# Patient Record
Sex: Male | Born: 1935
Health system: Southern US, Community
[De-identification: ages and names within clinical notes are randomized; demographics above are authoritative.]

## PROBLEM LIST (undated history)

## (undated) DIAGNOSIS — I5042 Chronic combined systolic (congestive) and diastolic (congestive) heart failure: Secondary | ICD-10-CM

## (undated) DIAGNOSIS — Z7901 Long term (current) use of anticoagulants: Secondary | ICD-10-CM

## (undated) DIAGNOSIS — I482 Chronic atrial fibrillation, unspecified: Secondary | ICD-10-CM

## (undated) DIAGNOSIS — I10 Essential (primary) hypertension: Secondary | ICD-10-CM

## (undated) DIAGNOSIS — H409 Unspecified glaucoma: Secondary | ICD-10-CM

## (undated) DIAGNOSIS — E871 Hypo-osmolality and hyponatremia: Secondary | ICD-10-CM

## (undated) DIAGNOSIS — T7840XA Allergy, unspecified, initial encounter: Secondary | ICD-10-CM

## (undated) DIAGNOSIS — G629 Polyneuropathy, unspecified: Secondary | ICD-10-CM

## (undated) DIAGNOSIS — K635 Polyp of colon: Secondary | ICD-10-CM

## (undated) DIAGNOSIS — K5792 Diverticulitis of intestine, part unspecified, without perforation or abscess without bleeding: Secondary | ICD-10-CM

## (undated) DIAGNOSIS — M1990 Unspecified osteoarthritis, unspecified site: Secondary | ICD-10-CM

## (undated) DIAGNOSIS — B019 Varicella without complication: Secondary | ICD-10-CM

## (undated) DIAGNOSIS — R011 Cardiac murmur, unspecified: Secondary | ICD-10-CM

## (undated) HISTORY — DX: Diverticulitis of intestine, part unspecified, without perforation or abscess without bleeding: K57.92

## (undated) HISTORY — DX: Essential (primary) hypertension: I10

## (undated) HISTORY — PX: APPENDECTOMY: SHX54

## (undated) HISTORY — DX: Varicella without complication: B01.9

## (undated) HISTORY — DX: Long term (current) use of anticoagulants: Z79.01

## (undated) HISTORY — DX: Unspecified osteoarthritis, unspecified site: M19.90

## (undated) HISTORY — DX: Chronic combined systolic (congestive) and diastolic (congestive) heart failure: I50.42

## (undated) HISTORY — DX: Hypo-osmolality and hyponatremia: E87.1

## (undated) HISTORY — DX: Chronic atrial fibrillation, unspecified: I48.20

## (undated) HISTORY — DX: Polyp of colon: K63.5

## (undated) HISTORY — PX: OTHER SURGICAL HISTORY: SHX169

## (undated) HISTORY — DX: Cardiac murmur, unspecified: R01.1

## (undated) HISTORY — DX: Unspecified glaucoma: H40.9

## (undated) HISTORY — DX: Allergy, unspecified, initial encounter: T78.40XA

---

## 2011-01-12 LAB — HM COLONOSCOPY

## 2011-05-09 DIAGNOSIS — I4891 Unspecified atrial fibrillation: Secondary | ICD-10-CM | POA: Diagnosis not present

## 2011-05-09 DIAGNOSIS — E669 Obesity, unspecified: Secondary | ICD-10-CM | POA: Diagnosis not present

## 2011-05-09 DIAGNOSIS — J029 Acute pharyngitis, unspecified: Secondary | ICD-10-CM | POA: Diagnosis not present

## 2011-05-09 DIAGNOSIS — I10 Essential (primary) hypertension: Secondary | ICD-10-CM | POA: Diagnosis not present

## 2011-05-11 DIAGNOSIS — I4891 Unspecified atrial fibrillation: Secondary | ICD-10-CM | POA: Diagnosis not present

## 2011-06-08 DIAGNOSIS — I4891 Unspecified atrial fibrillation: Secondary | ICD-10-CM | POA: Diagnosis not present

## 2011-06-30 DIAGNOSIS — Z86010 Personal history of colonic polyps: Secondary | ICD-10-CM | POA: Diagnosis not present

## 2011-06-30 DIAGNOSIS — K5730 Diverticulosis of large intestine without perforation or abscess without bleeding: Secondary | ICD-10-CM | POA: Diagnosis not present

## 2011-07-08 DIAGNOSIS — I10 Essential (primary) hypertension: Secondary | ICD-10-CM | POA: Diagnosis not present

## 2011-07-08 DIAGNOSIS — E162 Hypoglycemia, unspecified: Secondary | ICD-10-CM | POA: Diagnosis not present

## 2011-07-15 DIAGNOSIS — I4891 Unspecified atrial fibrillation: Secondary | ICD-10-CM | POA: Diagnosis not present

## 2011-08-07 DIAGNOSIS — Z7901 Long term (current) use of anticoagulants: Secondary | ICD-10-CM | POA: Diagnosis not present

## 2011-08-07 DIAGNOSIS — I4891 Unspecified atrial fibrillation: Secondary | ICD-10-CM | POA: Diagnosis not present

## 2011-09-04 DIAGNOSIS — I4891 Unspecified atrial fibrillation: Secondary | ICD-10-CM | POA: Diagnosis not present

## 2011-09-04 DIAGNOSIS — Z7901 Long term (current) use of anticoagulants: Secondary | ICD-10-CM | POA: Diagnosis not present

## 2011-09-24 DIAGNOSIS — H524 Presbyopia: Secondary | ICD-10-CM | POA: Diagnosis not present

## 2011-09-24 DIAGNOSIS — Z961 Presence of intraocular lens: Secondary | ICD-10-CM | POA: Diagnosis not present

## 2011-09-24 DIAGNOSIS — H40019 Open angle with borderline findings, low risk, unspecified eye: Secondary | ICD-10-CM | POA: Diagnosis not present

## 2011-10-04 DIAGNOSIS — J209 Acute bronchitis, unspecified: Secondary | ICD-10-CM | POA: Diagnosis not present

## 2011-10-06 DIAGNOSIS — I4891 Unspecified atrial fibrillation: Secondary | ICD-10-CM | POA: Diagnosis not present

## 2011-10-07 DIAGNOSIS — J209 Acute bronchitis, unspecified: Secondary | ICD-10-CM | POA: Diagnosis not present

## 2011-11-03 DIAGNOSIS — I4891 Unspecified atrial fibrillation: Secondary | ICD-10-CM | POA: Diagnosis not present

## 2011-11-03 DIAGNOSIS — Z7901 Long term (current) use of anticoagulants: Secondary | ICD-10-CM | POA: Diagnosis not present

## 2011-11-16 DIAGNOSIS — I4891 Unspecified atrial fibrillation: Secondary | ICD-10-CM | POA: Diagnosis not present

## 2011-11-30 DIAGNOSIS — Z7901 Long term (current) use of anticoagulants: Secondary | ICD-10-CM | POA: Diagnosis not present

## 2011-11-30 DIAGNOSIS — I4891 Unspecified atrial fibrillation: Secondary | ICD-10-CM | POA: Diagnosis not present

## 2011-12-25 DIAGNOSIS — H40019 Open angle with borderline findings, low risk, unspecified eye: Secondary | ICD-10-CM | POA: Diagnosis not present

## 2011-12-28 DIAGNOSIS — I4891 Unspecified atrial fibrillation: Secondary | ICD-10-CM | POA: Diagnosis not present

## 2011-12-28 DIAGNOSIS — Z7901 Long term (current) use of anticoagulants: Secondary | ICD-10-CM | POA: Diagnosis not present

## 2011-12-30 DIAGNOSIS — H348392 Tributary (branch) retinal vein occlusion, unspecified eye, stable: Secondary | ICD-10-CM | POA: Diagnosis not present

## 2011-12-30 DIAGNOSIS — Z961 Presence of intraocular lens: Secondary | ICD-10-CM | POA: Diagnosis not present

## 2011-12-30 DIAGNOSIS — H01009 Unspecified blepharitis unspecified eye, unspecified eyelid: Secondary | ICD-10-CM | POA: Diagnosis not present

## 2011-12-30 DIAGNOSIS — H5315 Visual distortions of shape and size: Secondary | ICD-10-CM | POA: Diagnosis not present

## 2011-12-30 DIAGNOSIS — H35349 Macular cyst, hole, or pseudohole, unspecified eye: Secondary | ICD-10-CM | POA: Diagnosis not present

## 2011-12-30 DIAGNOSIS — H35359 Cystoid macular degeneration, unspecified eye: Secondary | ICD-10-CM | POA: Diagnosis not present

## 2012-01-06 DIAGNOSIS — H35359 Cystoid macular degeneration, unspecified eye: Secondary | ICD-10-CM | POA: Diagnosis not present

## 2012-01-06 DIAGNOSIS — H01009 Unspecified blepharitis unspecified eye, unspecified eyelid: Secondary | ICD-10-CM | POA: Diagnosis not present

## 2012-01-06 DIAGNOSIS — H5315 Visual distortions of shape and size: Secondary | ICD-10-CM | POA: Diagnosis not present

## 2012-01-06 DIAGNOSIS — H35349 Macular cyst, hole, or pseudohole, unspecified eye: Secondary | ICD-10-CM | POA: Diagnosis not present

## 2012-01-06 DIAGNOSIS — H348392 Tributary (branch) retinal vein occlusion, unspecified eye, stable: Secondary | ICD-10-CM | POA: Diagnosis not present

## 2012-01-06 DIAGNOSIS — Z961 Presence of intraocular lens: Secondary | ICD-10-CM | POA: Diagnosis not present

## 2012-01-11 DIAGNOSIS — I4891 Unspecified atrial fibrillation: Secondary | ICD-10-CM | POA: Diagnosis not present

## 2012-01-11 DIAGNOSIS — Z7901 Long term (current) use of anticoagulants: Secondary | ICD-10-CM | POA: Diagnosis not present

## 2012-01-12 DIAGNOSIS — H348392 Tributary (branch) retinal vein occlusion, unspecified eye, stable: Secondary | ICD-10-CM | POA: Diagnosis not present

## 2012-01-12 DIAGNOSIS — H35349 Macular cyst, hole, or pseudohole, unspecified eye: Secondary | ICD-10-CM | POA: Diagnosis not present

## 2012-01-12 DIAGNOSIS — H35359 Cystoid macular degeneration, unspecified eye: Secondary | ICD-10-CM | POA: Diagnosis not present

## 2012-01-12 DIAGNOSIS — H01009 Unspecified blepharitis unspecified eye, unspecified eyelid: Secondary | ICD-10-CM | POA: Diagnosis not present

## 2012-01-12 DIAGNOSIS — H5315 Visual distortions of shape and size: Secondary | ICD-10-CM | POA: Diagnosis not present

## 2012-01-12 DIAGNOSIS — Z961 Presence of intraocular lens: Secondary | ICD-10-CM | POA: Diagnosis not present

## 2012-01-25 DIAGNOSIS — I4891 Unspecified atrial fibrillation: Secondary | ICD-10-CM | POA: Diagnosis not present

## 2012-01-25 DIAGNOSIS — Z7901 Long term (current) use of anticoagulants: Secondary | ICD-10-CM | POA: Diagnosis not present

## 2012-02-05 DIAGNOSIS — Z23 Encounter for immunization: Secondary | ICD-10-CM | POA: Diagnosis not present

## 2012-02-17 DIAGNOSIS — H348392 Tributary (branch) retinal vein occlusion, unspecified eye, stable: Secondary | ICD-10-CM | POA: Diagnosis not present

## 2012-02-17 DIAGNOSIS — H35349 Macular cyst, hole, or pseudohole, unspecified eye: Secondary | ICD-10-CM | POA: Diagnosis not present

## 2012-02-17 DIAGNOSIS — H35359 Cystoid macular degeneration, unspecified eye: Secondary | ICD-10-CM | POA: Diagnosis not present

## 2012-02-17 DIAGNOSIS — Z961 Presence of intraocular lens: Secondary | ICD-10-CM | POA: Diagnosis not present

## 2012-02-17 DIAGNOSIS — H5315 Visual distortions of shape and size: Secondary | ICD-10-CM | POA: Diagnosis not present

## 2012-02-17 DIAGNOSIS — H01009 Unspecified blepharitis unspecified eye, unspecified eyelid: Secondary | ICD-10-CM | POA: Diagnosis not present

## 2012-02-22 DIAGNOSIS — E785 Hyperlipidemia, unspecified: Secondary | ICD-10-CM | POA: Diagnosis not present

## 2012-03-16 DIAGNOSIS — Z7901 Long term (current) use of anticoagulants: Secondary | ICD-10-CM | POA: Diagnosis not present

## 2012-03-16 DIAGNOSIS — I4891 Unspecified atrial fibrillation: Secondary | ICD-10-CM | POA: Diagnosis not present

## 2012-03-18 DIAGNOSIS — H348392 Tributary (branch) retinal vein occlusion, unspecified eye, stable: Secondary | ICD-10-CM | POA: Diagnosis not present

## 2012-03-18 DIAGNOSIS — H01009 Unspecified blepharitis unspecified eye, unspecified eyelid: Secondary | ICD-10-CM | POA: Diagnosis not present

## 2012-03-18 DIAGNOSIS — H43819 Vitreous degeneration, unspecified eye: Secondary | ICD-10-CM | POA: Diagnosis not present

## 2012-03-18 DIAGNOSIS — Z961 Presence of intraocular lens: Secondary | ICD-10-CM | POA: Diagnosis not present

## 2012-03-18 DIAGNOSIS — H5315 Visual distortions of shape and size: Secondary | ICD-10-CM | POA: Diagnosis not present

## 2012-03-18 DIAGNOSIS — H02839 Dermatochalasis of unspecified eye, unspecified eyelid: Secondary | ICD-10-CM | POA: Diagnosis not present

## 2012-03-18 DIAGNOSIS — H35349 Macular cyst, hole, or pseudohole, unspecified eye: Secondary | ICD-10-CM | POA: Diagnosis not present

## 2012-03-18 DIAGNOSIS — H35359 Cystoid macular degeneration, unspecified eye: Secondary | ICD-10-CM | POA: Diagnosis not present

## 2012-03-21 DIAGNOSIS — M1990 Unspecified osteoarthritis, unspecified site: Secondary | ICD-10-CM | POA: Diagnosis not present

## 2012-03-21 DIAGNOSIS — I4891 Unspecified atrial fibrillation: Secondary | ICD-10-CM | POA: Diagnosis not present

## 2012-03-21 DIAGNOSIS — M25529 Pain in unspecified elbow: Secondary | ICD-10-CM | POA: Diagnosis not present

## 2012-03-23 DIAGNOSIS — H43819 Vitreous degeneration, unspecified eye: Secondary | ICD-10-CM | POA: Diagnosis not present

## 2012-03-23 DIAGNOSIS — H02839 Dermatochalasis of unspecified eye, unspecified eyelid: Secondary | ICD-10-CM | POA: Diagnosis not present

## 2012-03-23 DIAGNOSIS — H35349 Macular cyst, hole, or pseudohole, unspecified eye: Secondary | ICD-10-CM | POA: Diagnosis not present

## 2012-03-23 DIAGNOSIS — H348392 Tributary (branch) retinal vein occlusion, unspecified eye, stable: Secondary | ICD-10-CM | POA: Diagnosis not present

## 2012-03-23 DIAGNOSIS — Z961 Presence of intraocular lens: Secondary | ICD-10-CM | POA: Diagnosis not present

## 2012-03-23 DIAGNOSIS — H01009 Unspecified blepharitis unspecified eye, unspecified eyelid: Secondary | ICD-10-CM | POA: Diagnosis not present

## 2012-03-23 DIAGNOSIS — H35359 Cystoid macular degeneration, unspecified eye: Secondary | ICD-10-CM | POA: Diagnosis not present

## 2012-03-23 DIAGNOSIS — H5315 Visual distortions of shape and size: Secondary | ICD-10-CM | POA: Diagnosis not present

## 2012-03-29 DIAGNOSIS — H5315 Visual distortions of shape and size: Secondary | ICD-10-CM | POA: Diagnosis not present

## 2012-03-29 DIAGNOSIS — H348392 Tributary (branch) retinal vein occlusion, unspecified eye, stable: Secondary | ICD-10-CM | POA: Diagnosis not present

## 2012-03-29 DIAGNOSIS — H01009 Unspecified blepharitis unspecified eye, unspecified eyelid: Secondary | ICD-10-CM | POA: Diagnosis not present

## 2012-03-29 DIAGNOSIS — H02839 Dermatochalasis of unspecified eye, unspecified eyelid: Secondary | ICD-10-CM | POA: Diagnosis not present

## 2012-03-29 DIAGNOSIS — H43819 Vitreous degeneration, unspecified eye: Secondary | ICD-10-CM | POA: Diagnosis not present

## 2012-03-29 DIAGNOSIS — Z961 Presence of intraocular lens: Secondary | ICD-10-CM | POA: Diagnosis not present

## 2012-03-29 DIAGNOSIS — H35359 Cystoid macular degeneration, unspecified eye: Secondary | ICD-10-CM | POA: Diagnosis not present

## 2012-03-29 DIAGNOSIS — H35349 Macular cyst, hole, or pseudohole, unspecified eye: Secondary | ICD-10-CM | POA: Diagnosis not present

## 2012-04-18 DIAGNOSIS — Z7901 Long term (current) use of anticoagulants: Secondary | ICD-10-CM | POA: Diagnosis not present

## 2012-04-18 DIAGNOSIS — I4891 Unspecified atrial fibrillation: Secondary | ICD-10-CM | POA: Diagnosis not present

## 2012-04-27 DIAGNOSIS — H348392 Tributary (branch) retinal vein occlusion, unspecified eye, stable: Secondary | ICD-10-CM | POA: Diagnosis not present

## 2012-04-27 DIAGNOSIS — H35359 Cystoid macular degeneration, unspecified eye: Secondary | ICD-10-CM | POA: Diagnosis not present

## 2012-04-27 DIAGNOSIS — H01009 Unspecified blepharitis unspecified eye, unspecified eyelid: Secondary | ICD-10-CM | POA: Diagnosis not present

## 2012-04-27 DIAGNOSIS — H5315 Visual distortions of shape and size: Secondary | ICD-10-CM | POA: Diagnosis not present

## 2012-04-27 DIAGNOSIS — Z961 Presence of intraocular lens: Secondary | ICD-10-CM | POA: Diagnosis not present

## 2012-04-27 DIAGNOSIS — H43819 Vitreous degeneration, unspecified eye: Secondary | ICD-10-CM | POA: Diagnosis not present

## 2012-04-27 DIAGNOSIS — H35349 Macular cyst, hole, or pseudohole, unspecified eye: Secondary | ICD-10-CM | POA: Diagnosis not present

## 2012-04-27 DIAGNOSIS — H02839 Dermatochalasis of unspecified eye, unspecified eyelid: Secondary | ICD-10-CM | POA: Diagnosis not present

## 2012-05-06 DIAGNOSIS — H01009 Unspecified blepharitis unspecified eye, unspecified eyelid: Secondary | ICD-10-CM | POA: Diagnosis not present

## 2012-05-06 DIAGNOSIS — Z961 Presence of intraocular lens: Secondary | ICD-10-CM | POA: Diagnosis not present

## 2012-05-06 DIAGNOSIS — H43819 Vitreous degeneration, unspecified eye: Secondary | ICD-10-CM | POA: Diagnosis not present

## 2012-05-06 DIAGNOSIS — H35359 Cystoid macular degeneration, unspecified eye: Secondary | ICD-10-CM | POA: Diagnosis not present

## 2012-05-06 DIAGNOSIS — H5315 Visual distortions of shape and size: Secondary | ICD-10-CM | POA: Diagnosis not present

## 2012-05-06 DIAGNOSIS — H348392 Tributary (branch) retinal vein occlusion, unspecified eye, stable: Secondary | ICD-10-CM | POA: Diagnosis not present

## 2012-05-06 DIAGNOSIS — H35349 Macular cyst, hole, or pseudohole, unspecified eye: Secondary | ICD-10-CM | POA: Diagnosis not present

## 2012-05-06 DIAGNOSIS — H02839 Dermatochalasis of unspecified eye, unspecified eyelid: Secondary | ICD-10-CM | POA: Diagnosis not present

## 2012-05-17 DIAGNOSIS — I4891 Unspecified atrial fibrillation: Secondary | ICD-10-CM | POA: Diagnosis not present

## 2012-05-20 DIAGNOSIS — H43819 Vitreous degeneration, unspecified eye: Secondary | ICD-10-CM | POA: Diagnosis not present

## 2012-05-20 DIAGNOSIS — H02839 Dermatochalasis of unspecified eye, unspecified eyelid: Secondary | ICD-10-CM | POA: Diagnosis not present

## 2012-05-20 DIAGNOSIS — H348392 Tributary (branch) retinal vein occlusion, unspecified eye, stable: Secondary | ICD-10-CM | POA: Diagnosis not present

## 2012-05-20 DIAGNOSIS — H35349 Macular cyst, hole, or pseudohole, unspecified eye: Secondary | ICD-10-CM | POA: Diagnosis not present

## 2012-05-20 DIAGNOSIS — H35359 Cystoid macular degeneration, unspecified eye: Secondary | ICD-10-CM | POA: Diagnosis not present

## 2012-05-20 DIAGNOSIS — H5315 Visual distortions of shape and size: Secondary | ICD-10-CM | POA: Diagnosis not present

## 2012-05-20 DIAGNOSIS — Z961 Presence of intraocular lens: Secondary | ICD-10-CM | POA: Diagnosis not present

## 2012-05-20 DIAGNOSIS — H01009 Unspecified blepharitis unspecified eye, unspecified eyelid: Secondary | ICD-10-CM | POA: Diagnosis not present

## 2012-05-23 DIAGNOSIS — I4891 Unspecified atrial fibrillation: Secondary | ICD-10-CM | POA: Diagnosis not present

## 2012-06-03 DIAGNOSIS — I4891 Unspecified atrial fibrillation: Secondary | ICD-10-CM | POA: Diagnosis not present

## 2012-06-03 DIAGNOSIS — R609 Edema, unspecified: Secondary | ICD-10-CM | POA: Diagnosis not present

## 2012-06-08 DIAGNOSIS — M79609 Pain in unspecified limb: Secondary | ICD-10-CM | POA: Diagnosis not present

## 2012-06-08 DIAGNOSIS — I4891 Unspecified atrial fibrillation: Secondary | ICD-10-CM | POA: Diagnosis not present

## 2012-06-08 DIAGNOSIS — M25569 Pain in unspecified knee: Secondary | ICD-10-CM | POA: Diagnosis not present

## 2012-06-08 DIAGNOSIS — M7989 Other specified soft tissue disorders: Secondary | ICD-10-CM | POA: Diagnosis not present

## 2012-06-16 DIAGNOSIS — Z961 Presence of intraocular lens: Secondary | ICD-10-CM | POA: Diagnosis not present

## 2012-06-16 DIAGNOSIS — H43819 Vitreous degeneration, unspecified eye: Secondary | ICD-10-CM | POA: Diagnosis not present

## 2012-06-16 DIAGNOSIS — H5315 Visual distortions of shape and size: Secondary | ICD-10-CM | POA: Diagnosis not present

## 2012-06-16 DIAGNOSIS — H35349 Macular cyst, hole, or pseudohole, unspecified eye: Secondary | ICD-10-CM | POA: Diagnosis not present

## 2012-06-16 DIAGNOSIS — H01009 Unspecified blepharitis unspecified eye, unspecified eyelid: Secondary | ICD-10-CM | POA: Diagnosis not present

## 2012-06-16 DIAGNOSIS — H35359 Cystoid macular degeneration, unspecified eye: Secondary | ICD-10-CM | POA: Diagnosis not present

## 2012-06-16 DIAGNOSIS — H348392 Tributary (branch) retinal vein occlusion, unspecified eye, stable: Secondary | ICD-10-CM | POA: Diagnosis not present

## 2012-06-16 DIAGNOSIS — H02839 Dermatochalasis of unspecified eye, unspecified eyelid: Secondary | ICD-10-CM | POA: Diagnosis not present

## 2012-06-22 DIAGNOSIS — H40019 Open angle with borderline findings, low risk, unspecified eye: Secondary | ICD-10-CM | POA: Diagnosis not present

## 2012-07-04 DIAGNOSIS — I4891 Unspecified atrial fibrillation: Secondary | ICD-10-CM | POA: Diagnosis not present

## 2012-08-01 DIAGNOSIS — I4891 Unspecified atrial fibrillation: Secondary | ICD-10-CM | POA: Diagnosis not present

## 2012-08-01 DIAGNOSIS — Z7901 Long term (current) use of anticoagulants: Secondary | ICD-10-CM | POA: Diagnosis not present

## 2012-08-29 DIAGNOSIS — Z7901 Long term (current) use of anticoagulants: Secondary | ICD-10-CM | POA: Diagnosis not present

## 2012-08-29 DIAGNOSIS — I4891 Unspecified atrial fibrillation: Secondary | ICD-10-CM | POA: Diagnosis not present

## 2012-08-29 DIAGNOSIS — Z79899 Other long term (current) drug therapy: Secondary | ICD-10-CM | POA: Diagnosis not present

## 2012-08-29 DIAGNOSIS — I10 Essential (primary) hypertension: Secondary | ICD-10-CM | POA: Diagnosis not present

## 2012-08-29 LAB — POCT INR: INR: 2.2 — AB (ref <=1.1)

## 2012-08-29 LAB — PROTIME-INR

## 2012-08-29 LAB — CBC AND DIFFERENTIAL: Hemoglobin: 13.5 g/dL (ref 13.5–17.5)

## 2012-08-29 LAB — BASIC METABOLIC PANEL
Creatinine: 0.9 mg/dL (ref <=1.3)
Sodium: 134 mmol/L — AB (ref 137–147)

## 2012-08-30 DIAGNOSIS — I4891 Unspecified atrial fibrillation: Secondary | ICD-10-CM | POA: Diagnosis not present

## 2012-08-30 DIAGNOSIS — R0609 Other forms of dyspnea: Secondary | ICD-10-CM | POA: Diagnosis not present

## 2012-08-31 DIAGNOSIS — H348392 Tributary (branch) retinal vein occlusion, unspecified eye, stable: Secondary | ICD-10-CM | POA: Diagnosis not present

## 2012-08-31 DIAGNOSIS — H5315 Visual distortions of shape and size: Secondary | ICD-10-CM | POA: Diagnosis not present

## 2012-08-31 DIAGNOSIS — H02839 Dermatochalasis of unspecified eye, unspecified eyelid: Secondary | ICD-10-CM | POA: Diagnosis not present

## 2012-08-31 DIAGNOSIS — Z961 Presence of intraocular lens: Secondary | ICD-10-CM | POA: Diagnosis not present

## 2012-08-31 DIAGNOSIS — H35349 Macular cyst, hole, or pseudohole, unspecified eye: Secondary | ICD-10-CM | POA: Diagnosis not present

## 2012-08-31 DIAGNOSIS — H43819 Vitreous degeneration, unspecified eye: Secondary | ICD-10-CM | POA: Diagnosis not present

## 2012-08-31 DIAGNOSIS — H01009 Unspecified blepharitis unspecified eye, unspecified eyelid: Secondary | ICD-10-CM | POA: Diagnosis not present

## 2012-09-26 DIAGNOSIS — I4891 Unspecified atrial fibrillation: Secondary | ICD-10-CM | POA: Diagnosis not present

## 2012-10-24 DIAGNOSIS — I4891 Unspecified atrial fibrillation: Secondary | ICD-10-CM | POA: Diagnosis not present

## 2012-11-25 DIAGNOSIS — I4891 Unspecified atrial fibrillation: Secondary | ICD-10-CM | POA: Diagnosis not present

## 2012-11-25 DIAGNOSIS — Z7901 Long term (current) use of anticoagulants: Secondary | ICD-10-CM | POA: Diagnosis not present

## 2012-11-28 DIAGNOSIS — I4891 Unspecified atrial fibrillation: Secondary | ICD-10-CM | POA: Diagnosis not present

## 2012-11-28 DIAGNOSIS — Z23 Encounter for immunization: Secondary | ICD-10-CM | POA: Diagnosis not present

## 2012-11-30 DIAGNOSIS — H02839 Dermatochalasis of unspecified eye, unspecified eyelid: Secondary | ICD-10-CM | POA: Diagnosis not present

## 2012-11-30 DIAGNOSIS — H348392 Tributary (branch) retinal vein occlusion, unspecified eye, stable: Secondary | ICD-10-CM | POA: Diagnosis not present

## 2012-11-30 DIAGNOSIS — Z961 Presence of intraocular lens: Secondary | ICD-10-CM | POA: Diagnosis not present

## 2012-11-30 DIAGNOSIS — H35349 Macular cyst, hole, or pseudohole, unspecified eye: Secondary | ICD-10-CM | POA: Diagnosis not present

## 2012-11-30 DIAGNOSIS — H5315 Visual distortions of shape and size: Secondary | ICD-10-CM | POA: Diagnosis not present

## 2012-11-30 DIAGNOSIS — H43819 Vitreous degeneration, unspecified eye: Secondary | ICD-10-CM | POA: Diagnosis not present

## 2012-11-30 DIAGNOSIS — H01009 Unspecified blepharitis unspecified eye, unspecified eyelid: Secondary | ICD-10-CM | POA: Diagnosis not present

## 2012-12-06 DIAGNOSIS — N429 Disorder of prostate, unspecified: Secondary | ICD-10-CM | POA: Diagnosis not present

## 2012-12-09 DIAGNOSIS — I4891 Unspecified atrial fibrillation: Secondary | ICD-10-CM | POA: Diagnosis not present

## 2012-12-09 DIAGNOSIS — Z7901 Long term (current) use of anticoagulants: Secondary | ICD-10-CM | POA: Diagnosis not present

## 2013-01-06 ENCOUNTER — Ambulatory Visit (INDEPENDENT_AMBULATORY_CARE_PROVIDER_SITE_OTHER): Payer: Medicare Other | Admitting: General Practice

## 2013-01-06 DIAGNOSIS — I4891 Unspecified atrial fibrillation: Secondary | ICD-10-CM | POA: Diagnosis not present

## 2013-01-06 DIAGNOSIS — Z7901 Long term (current) use of anticoagulants: Secondary | ICD-10-CM | POA: Diagnosis not present

## 2013-01-23 ENCOUNTER — Ambulatory Visit (INDEPENDENT_AMBULATORY_CARE_PROVIDER_SITE_OTHER): Payer: Medicare Other | Admitting: Internal Medicine

## 2013-01-23 ENCOUNTER — Encounter: Payer: Self-pay | Admitting: Internal Medicine

## 2013-01-23 VITALS — BP 122/82 | HR 62 | Temp 97.9°F | Ht 71.0 in | Wt 203.2 lb

## 2013-01-23 DIAGNOSIS — I4891 Unspecified atrial fibrillation: Secondary | ICD-10-CM | POA: Diagnosis not present

## 2013-01-23 DIAGNOSIS — N400 Enlarged prostate without lower urinary tract symptoms: Secondary | ICD-10-CM

## 2013-01-23 DIAGNOSIS — M1712 Unilateral primary osteoarthritis, left knee: Secondary | ICD-10-CM | POA: Insufficient documentation

## 2013-01-23 NOTE — Patient Instructions (Signed)
Health Maintenance, Males A healthy lifestyle and preventative care can promote health and wellness.  Maintain regular health, dental, and eye exams.  Eat a healthy diet. Foods like vegetables, fruits, whole grains, low-fat dairy products, and lean protein foods contain the nutrients you need without too many calories. Decrease your intake of foods high in solid fats, added sugars, and salt. Get information about a proper diet from your caregiver, if necessary.  Regular physical exercise is one of the most important things you can do for your health. Most adults should get at least 150 minutes of moderate-intensity exercise (any activity that increases your heart rate and causes you to sweat) each week. In addition, most adults need muscle-strengthening exercises on 2 or more days a week.   Maintain a healthy weight. The body mass index (BMI) is a screening tool to identify possible weight problems. It provides an estimate of body fat based on height and weight. Your caregiver can help determine your BMI, and can help you achieve or maintain a healthy weight. For adults 20 years and older:  A BMI below 18.5 is considered underweight.  A BMI of 18.5 to 24.9 is normal.  A BMI of 25 to 29.9 is considered overweight.  A BMI of 30 and above is considered obese.  Maintain normal blood lipids and cholesterol by exercising and minimizing your intake of saturated fat. Eat a balanced diet with plenty of fruits and vegetables. Blood tests for lipids and cholesterol should begin at age 20 and be repeated every 5 years. If your lipid or cholesterol levels are high, you are over 50, or you are a high risk for heart disease, you may need your cholesterol levels checked more frequently.Ongoing high lipid and cholesterol levels should be treated with medicines, if diet and exercise are not effective.  If you smoke, find out from your caregiver how to quit. If you do not use tobacco, do not start.  If you  choose to drink alcohol, do not exceed 2 drinks per day. One drink is considered to be 12 ounces (355 mL) of beer, 5 ounces (148 mL) of wine, or 1.5 ounces (44 mL) of liquor.  Avoid use of street drugs. Do not share needles with anyone. Ask for help if you need support or instructions about stopping the use of drugs.  High blood pressure causes heart disease and increases the risk of stroke. Blood pressure should be checked at least every 1 to 2 years. Ongoing high blood pressure should be treated with medicines if weight loss and exercise are not effective.  If you are 45 to 77 years old, ask your caregiver if you should take aspirin to prevent heart disease.  Diabetes screening involves taking a blood sample to check your fasting blood sugar level. This should be done once every 3 years, after age 45, if you are within normal weight and without risk factors for diabetes. Testing should be considered at a younger age or be carried out more frequently if you are overweight and have at least 1 risk factor for diabetes.  Colorectal cancer can be detected and often prevented. Most routine colorectal cancer screening begins at the age of 50 and continues through age 75. However, your caregiver may recommend screening at an earlier age if you have risk factors for colon cancer. On a yearly basis, your caregiver may provide home test kits to check for hidden blood in the stool. Use of a small camera at the end of a tube,   to directly examine the colon (sigmoidoscopy or colonoscopy), can detect the earliest forms of colorectal cancer. Talk to your caregiver about this at age 50, when routine screening begins. Direct examination of the colon should be repeated every 5 to 10 years through age 75, unless early forms of pre-cancerous polyps or small growths are found.  Hepatitis C blood testing is recommended for all people born from 1945 through 1965 and any individual with known risks for hepatitis C.  Healthy  men should no longer receive prostate-specific antigen (PSA) blood tests as part of routine cancer screening. Consult with your caregiver about prostate cancer screening.  Testicular cancer screening is not recommended for adolescents or adult males who have no symptoms. Screening includes self-exam, caregiver exam, and other screening tests. Consult with your caregiver about any symptoms you have or any concerns you have about testicular cancer.  Practice safe sex. Use condoms and avoid high-risk sexual practices to reduce the spread of sexually transmitted infections (STIs).  Use sunscreen with a sun protection factor (SPF) of 30 or greater. Apply sunscreen liberally and repeatedly throughout the day. You should seek shade when your shadow is shorter than you. Protect yourself by wearing long sleeves, pants, a wide-brimmed hat, and sunglasses year round, whenever you are outdoors.  Notify your caregiver of new moles or changes in moles, especially if there is a change in shape or color. Also notify your caregiver if a mole is larger than the size of a pencil eraser.  A one-time screening for abdominal aortic aneurysm (AAA) and surgical repair of large AAAs by sound wave imaging (ultrasonography) is recommended for ages 65 to 75 years who are current or former smokers.  Stay current with your immunizations. Document Released: 09/26/2007 Document Revised: 06/22/2011 Document Reviewed: 08/25/2010 [employer] Patient Information 2014 [employer].  

## 2013-01-23 NOTE — Assessment & Plan Note (Signed)
Rate controlled with atenolol Denies shortness of breath/chest pain Continue to see coumadin clinic  No treatment changes today

## 2013-01-23 NOTE — Assessment & Plan Note (Signed)
On flomax-continue

## 2013-01-23 NOTE — Progress Notes (Signed)
HPI  Pt presents to the clinic today to establish care. He recently moved from [place], [place] to be closer to family. He is transferring care from his PCP- Dr. Malen Gauze. He has no concerns today.  Flu: unsure if he got one this year Tetanus: unsure Pneumovax: unsure Zostovax: unsure Colonoscopy: 2012 PSA screening: 2013 Eye Doctor: yearly Dentist: yearly   Past Medical History  Diagnosis Date  . Arthritis   . Chicken pox   . Diverticulitis   . Glaucoma   . Allergy   . Heart murmur   . Colon polyps     Current Outpatient Prescriptions  Medication Sig Dispense Refill  . atenolol (TENORMIN) 25 MG tablet Take 25 mg by mouth daily.      . Multiple Vitamins-Minerals (ICAPS) CAPS Take by mouth daily.      . tamsulosin (FLOMAX) 0.4 MG CAPS capsule Take 0.4 mg by mouth daily.      Marland Kitchen warfarin (COUMADIN) 5 MG tablet Take 5 mg by mouth daily.       No current facility-administered medications for this visit.    Not on File  Family History  Problem Relation Age of Onset  . Stroke Other   . Heart disease Father   . Cancer Brother     lung, brain tumor  . Diabetes Neg Hx     History   Social History  . Marital Status: Widowed    Spouse Name: N/A    Number of Children: N/A  . Years of Education: N/A   Occupational History  . Not on file.   Social History Main Topics  . Smoking status: Never Smoker   . Smokeless tobacco: Not on file  . Alcohol Use: Yes  . Drug Use: No  . Sexual Activity: No   Other Topics Concern  . Not on file   Social History Narrative  . No narrative on file    ROS:  Constitutional: Denies fever, malaise, fatigue, headache or abrupt weight changes.  HEENT: Pt reports blurred vision. Denies eye pain, eye redness, ear pain, ringing in the ears, wax buildup, runny nose, nasal congestion, bloody nose, or sore throat. Respiratory: Denies difficulty breathing, shortness of breath, cough or sputum production.   Cardiovascular: Denies chest  pain, chest tightness, palpitations or swelling in the hands or feet.  Gastrointestinal: Denies abdominal pain, bloating, constipation, diarrhea or blood in the stool.  GU: Pt reports urge incontinence. Denies frequency, urgency, pain with urination, blood in urine, odor or discharge. Musculoskeletal: Pt reports arthritis in left knee. Denies decrease in range of motion, difficulty with gait, muscle pain.  Skin: Denies redness, rashes, lesions or ulcercations.  Neurological: Pt reports difficulty with memory. Denies dizziness, difficulty with speech or problems with balance and coordination.   No other specific complaints in a complete review of systems (except as listed in HPI above).  PE:  BP 122/82  Pulse 62  Temp(Src) 97.9 F (36.6 C) (Oral)  Ht 5\' 11"  (1.803 m)  Wt 203 lb 4 oz (92.194 kg)  BMI 28.36 kg/m2  SpO2 98% Wt Readings from Last 3 Encounters:  01/23/13 203 lb 4 oz (92.194 kg)    General: Appears his stated age, well developed, well nourished in NAD. HEENT: Head: normal shape and size; Eyes: sclera white, no icterus, conjunctiva pink, PERRLA and EOMs intact; Left Ear: Tm's gray and intact, normal light reflex, Right ear: cerumen impaction; Nose: mucosa pink and moist, septum midline; Throat/Mouth: Teeth present, mucosa pink and moist, no lesions  or ulcerations noted.  Neck: Normal range of motion. Neck supple, trachea midline. No massses, lumps or thyromegaly present.  Cardiovascular: Normal rate and rhythm. S1,S2 noted.  Murmur noted. No rubs or gallops noted. No JVD or BLE edema. No carotid bruits noted. Pulmonary/Chest: Normal effort and positive vesicular breath sounds. No respiratory distress. No wheezes, rales or ronchi noted.  Abdomen: Soft and nontender. Normal bowel sounds, no bruits noted. No distention or masses noted. Liver, spleen and kidneys non palpable. Musculoskeletal: Normal range of motion. 1+ swelling of left knee, crepitus noted with ROM. No difficulty  with gait. Mild kyphosis. Neurological: Alert and oriented. Cranial nerves II-XII intact. Coordination normal. +DTRs bilaterally. Pt declines MMSE- states "my memory problem is just due to old age" Psychiatric: Mood and affect normal. Behavior is normal. Judgment normal. thought content seems delayed.    Assessment and Plan:  Health Maintenance:  Pt is unsure about his immunization statues- will request records from Dr. Malen Gauze Pt reports he just had blood work done- will obtain records  RTC in 6 months or sooner if needed

## 2013-01-23 NOTE — Assessment & Plan Note (Signed)
Takes tylenol prn Not interested in referral to ortho at this time

## 2013-01-30 ENCOUNTER — Ambulatory Visit (INDEPENDENT_AMBULATORY_CARE_PROVIDER_SITE_OTHER): Payer: Medicare Other | Admitting: Pharmacist

## 2013-01-30 DIAGNOSIS — Z7901 Long term (current) use of anticoagulants: Secondary | ICD-10-CM

## 2013-01-30 DIAGNOSIS — I4891 Unspecified atrial fibrillation: Secondary | ICD-10-CM | POA: Diagnosis not present

## 2013-02-02 ENCOUNTER — Encounter: Payer: Self-pay | Admitting: Internal Medicine

## 2013-02-28 ENCOUNTER — Ambulatory Visit (INDEPENDENT_AMBULATORY_CARE_PROVIDER_SITE_OTHER): Payer: Medicare Other | Admitting: General Practice

## 2013-02-28 DIAGNOSIS — Z7901 Long term (current) use of anticoagulants: Secondary | ICD-10-CM

## 2013-02-28 DIAGNOSIS — I4891 Unspecified atrial fibrillation: Secondary | ICD-10-CM

## 2013-02-28 LAB — POCT INR: INR: 2.2

## 2013-02-28 NOTE — Progress Notes (Signed)
Pre-visit discussion using our clinic review tool. No additional management support is needed unless otherwise documented below in the visit note.  

## 2013-03-14 DIAGNOSIS — H353190 Nonexudative age-related macular degeneration, unspecified eye, stage unspecified: Secondary | ICD-10-CM | POA: Diagnosis not present

## 2013-03-14 DIAGNOSIS — H353290 Exudative age-related macular degeneration, unspecified eye, stage unspecified: Secondary | ICD-10-CM | POA: Diagnosis not present

## 2013-04-11 ENCOUNTER — Ambulatory Visit (INDEPENDENT_AMBULATORY_CARE_PROVIDER_SITE_OTHER): Payer: Medicare Other | Admitting: General Practice

## 2013-04-11 DIAGNOSIS — Z5181 Encounter for therapeutic drug level monitoring: Secondary | ICD-10-CM

## 2013-04-11 DIAGNOSIS — Z7901 Long term (current) use of anticoagulants: Secondary | ICD-10-CM

## 2013-04-11 LAB — POCT INR: INR: 2.1

## 2013-04-11 NOTE — Progress Notes (Signed)
Pre-visit discussion using our clinic review tool. No additional management support is needed unless otherwise documented below in the visit note.  

## 2013-05-08 DIAGNOSIS — H353190 Nonexudative age-related macular degeneration, unspecified eye, stage unspecified: Secondary | ICD-10-CM | POA: Diagnosis not present

## 2013-05-08 DIAGNOSIS — H35379 Puckering of macula, unspecified eye: Secondary | ICD-10-CM | POA: Diagnosis not present

## 2013-05-08 DIAGNOSIS — H40019 Open angle with borderline findings, low risk, unspecified eye: Secondary | ICD-10-CM | POA: Diagnosis not present

## 2013-05-23 ENCOUNTER — Ambulatory Visit (INDEPENDENT_AMBULATORY_CARE_PROVIDER_SITE_OTHER): Payer: Medicare Other | Admitting: General Practice

## 2013-05-23 DIAGNOSIS — Z7901 Long term (current) use of anticoagulants: Secondary | ICD-10-CM | POA: Diagnosis not present

## 2013-05-23 DIAGNOSIS — Z5181 Encounter for therapeutic drug level monitoring: Secondary | ICD-10-CM

## 2013-05-23 DIAGNOSIS — I4891 Unspecified atrial fibrillation: Secondary | ICD-10-CM | POA: Diagnosis not present

## 2013-05-23 LAB — POCT INR: INR: 2.4

## 2013-05-23 NOTE — Progress Notes (Signed)
Pre-visit discussion using our clinic review tool. No additional management support is needed unless otherwise documented below in the visit note.  

## 2013-06-28 ENCOUNTER — Other Ambulatory Visit: Payer: Self-pay

## 2013-06-28 NOTE — Telephone Encounter (Signed)
Spoke with Colin MantleAlice Miller pts daughter and she request 90 day refill atenolol 25 mg taking one daily and Flomax 0.4 mg taking one daily to [facility]; pt last seen by [name]  01/23/13 and does not have future appt; [name] has never filled med before.Please advise. Pt will continue going to [facility] to anti coagulation clinic.

## 2013-06-28 NOTE — Telephone Encounter (Signed)
Who has he been getting his meds from? If [facility] can not come [facility], he should [facility] a [facility] PCP [facility].

## 2013-06-28 NOTE — Telephone Encounter (Signed)
The patient's daughter called and is need of a refill on several medications that are expired.  The daughter was informed that R.Baity has moved to a different location and that this message would be forwarded to the staff at [facility].  She was unclear about exactly which medications were needed for the patient.  I apologize for the vague message.   Daughter's Name - Katherine Mantlelice McGowen - phone - [phone]

## 2013-06-28 NOTE — Telephone Encounter (Signed)
Called pt back directly.  I apologize for this confusion.Marland Kitchen.Marland Kitchen.Marland Kitchen.Pt is establishing care with N[facility], and i advised him his medicine would be refilled during his appointment.

## 2013-07-03 ENCOUNTER — Ambulatory Visit (INDEPENDENT_AMBULATORY_CARE_PROVIDER_SITE_OTHER): Payer: Medicare Other | Admitting: Physician Assistant

## 2013-07-03 ENCOUNTER — Encounter: Payer: Self-pay | Admitting: Physician Assistant

## 2013-07-03 VITALS — BP 140/80 | HR 51 | Temp 97.0°F | Ht 71.0 in | Wt 207.0 lb

## 2013-07-03 DIAGNOSIS — H9190 Unspecified hearing loss, unspecified ear: Secondary | ICD-10-CM

## 2013-07-03 DIAGNOSIS — I4891 Unspecified atrial fibrillation: Secondary | ICD-10-CM

## 2013-07-03 DIAGNOSIS — H938X9 Other specified disorders of ear, unspecified ear: Secondary | ICD-10-CM | POA: Diagnosis not present

## 2013-07-03 DIAGNOSIS — M1710 Unilateral primary osteoarthritis, unspecified knee: Secondary | ICD-10-CM | POA: Diagnosis not present

## 2013-07-03 DIAGNOSIS — N400 Enlarged prostate without lower urinary tract symptoms: Secondary | ICD-10-CM | POA: Diagnosis not present

## 2013-07-03 DIAGNOSIS — M1712 Unilateral primary osteoarthritis, left knee: Secondary | ICD-10-CM

## 2013-07-03 DIAGNOSIS — IMO0002 Reserved for concepts with insufficient information to code with codable children: Secondary | ICD-10-CM

## 2013-07-03 MED ORDER — WARFARIN SODIUM 5 MG PO TABS: 5.0000 mg | ORAL_TABLET | Freq: Every day | ORAL | Status: DC

## 2013-07-03 MED ORDER — ATENOLOL 25 MG PO TABS: 25.0000 mg | ORAL_TABLET | Freq: Every day | ORAL | Status: DC

## 2013-07-03 MED ORDER — TAMSULOSIN HCL 0.4 MG PO CAPS: 0.4000 mg | ORAL_CAPSULE | Freq: Every day | ORAL | Status: DC

## 2013-07-03 NOTE — Assessment & Plan Note (Signed)
Controlled with Atenolol 25 mg once daily Warfarin 5 mg tablet, dosing by warfarin clinic

## 2013-07-03 NOTE — Progress Notes (Signed)
   Subjective:    Patient ID: Colin Miller, male    DOB: 09/02/1926, 80 y.o.   MRN: [id number]  HPI Comments: Patient is an 80 year old male who presents to the office for a follow up and refills of his current medications. States he is taking all medications as prescribed. States all is going well for him. He lives a couple houses away from his sister and they keep each other company and share meals together. Reports he would like to have his hearing evaluated, noticing he does not hear as well as he use to. Chronic arthritis pain in left knee. No other concerns or problems at this time. Denies chest pain, palpitations, headache,dizziness, lightheaded, weakness, bowel/bladder changes, SOB, cough or chest tightness.    Review of Systems  Constitutional: Negative for fever.  Eyes: Negative for pain and visual disturbance.  Respiratory: Negative for cough, chest tightness and shortness of breath.   Cardiovascular: Negative for chest pain, palpitations and leg swelling.  Gastrointestinal: Negative for constipation.  Genitourinary: Negative for urgency, hematuria and difficulty urinating.  Musculoskeletal: Positive for arthralgias (left knee pain, chronic) and gait problem (secondary to knee pain).  Skin: Negative for rash.  Neurological: Negative for dizziness, weakness, light-headedness, numbness and headaches.   Past Medical History  Diagnosis Date  . Arthritis   . Chicken pox   . Diverticulitis   . Glaucoma   . Allergy   . Heart murmur   . Colon polyps        Objective:   Physical Exam  Vitals reviewed. Constitutional: He is oriented to person, place, and time. He appears well-developed and well-nourished. No distress.  HENT:  Head: Normocephalic and atraumatic.  Right Ear: External ear normal.  Left Ear: External ear normal.  Eyes: Conjunctivae are normal. No scleral icterus.  Neck: Trachea normal and normal range of motion. Normal carotid pulses present. Carotid bruit is not  present. No mass present.  Cardiovascular: Normal rate, regular rhythm and intact distal pulses.  Exam reveals no gallop and no friction rub.   No murmur heard. Pulses:      Radial pulses are 2+ on the right side, and 2+ on the left side.       Posterior tibial pulses are 1+ on the right side, and 1+ on the left side.  No lower extremity edema  Pulmonary/Chest: Effort normal. No accessory muscle usage. No respiratory distress. He has decreased breath sounds. He has no wheezes. He has no rhonchi. He has no rales.  Musculoskeletal: Normal range of motion.  Neurological: He is alert and oriented to person, place, and time.  Skin: Skin is warm and dry. He is not diaphoretic.  Psychiatric: He has a normal mood and affect.   Filed Vitals:   07/03/13 1023  BP: 140/80  Pulse: 51  Temp: 97 F (36.1 C)   Wt Readings from Last 3 Encounters:  07/03/13 207 lb (93.895 kg)  01/23/13 203 lb 4 oz (92.194 kg)       Assessment & Plan:   Labs ordered today: CBC, BMET, TSH, PSA  Refills for chronic medications today.  Arthritis, left knee Not interested in joint replacement at this time.  Atrial fibrillation Controlled with Atenolol 25 mg once daily Warfarin 5 mg tablet, dosing by warfarin clinic  BPH Well controlled with Flomax 0.4 mg caps once daily

## 2013-07-03 NOTE — Assessment & Plan Note (Signed)
Well controlled with Flomax 0.4 mg caps once daily PSA today with labs

## 2013-07-03 NOTE — Patient Instructions (Signed)
It was great meeting you today Mr. Colin Miller!   Refills for your prescriptions have been sent to your mail in pharmacy.    Atrial Fibrillation Atrial fibrillation is a condition that causes your heart to beat irregularly. It may also cause your heart to beat faster than normal. Atrial fibrillation can prevent your heart from pumping blood normally. It increases your risk of stroke and heart problems. HOME CARE  Take medications as told by your doctor.  Only take medications that your doctor says are safe. Some medications can make the condition worse or happen again.  If blood thinners were prescribed by your doctor, take them exactly as told. Too much can cause bleeding. Too little and you will not have the needed protection against stroke and other problems.  Perform blood tests at home if told by your doctor.  Perform blood tests exactly as told by your doctor.  Do not drink alcohol.  Do not drink beverages with caffeine such as coffee, soda, and some teas.  Maintain a healthy weight.  Do not use diet pills unless your doctor says they are safe. They may make heart problems worse.  Follow diet instructions as told by your doctor.  Exercise regularly as told by your doctor.  Keep all follow-up appointments. GET HELP RIGHT AWAY IF:   You have chest or belly (abdominal) pain.  You feel sick to your stomach (nauseous)  You suddenly have swollen feet and ankles.  You feel dizzy.  You face, arms, or legs feel numb or weak.  There is a change in your vision or speech.  You notice a change in the speed, rhythm, or strength of your heartbeat.  You suddenly begin peeing (urinating) more often.  You get tired more easily when moving or exercising. MAKE SURE YOU:   Understand these instructions.  Will watch your condition.  Will get help right away if you are not doing well or get worse. Document Released: 01/07/2008 Document Revised: 07/25/2012 Document Reviewed:  05/10/2012 [facility] Patient Information 2014 [employer] [place].

## 2013-07-03 NOTE — Assessment & Plan Note (Signed)
Arthritis, left knee Not interested in joint replacement at this time. Continue with Tylenol for symptom relief Uses cane to walk long distances.

## 2013-07-03 NOTE — Progress Notes (Signed)
Pre visit review using our clinic review tool, if applicable. No additional management support is needed unless otherwise documented below in the visit note. 

## 2013-07-04 ENCOUNTER — Other Ambulatory Visit (INDEPENDENT_AMBULATORY_CARE_PROVIDER_SITE_OTHER): Payer: Medicare Other

## 2013-07-04 ENCOUNTER — Ambulatory Visit (INDEPENDENT_AMBULATORY_CARE_PROVIDER_SITE_OTHER): Payer: Medicare Other | Admitting: General Practice

## 2013-07-04 DIAGNOSIS — M1710 Unilateral primary osteoarthritis, unspecified knee: Secondary | ICD-10-CM

## 2013-07-04 DIAGNOSIS — Z7901 Long term (current) use of anticoagulants: Secondary | ICD-10-CM | POA: Diagnosis not present

## 2013-07-04 DIAGNOSIS — I4891 Unspecified atrial fibrillation: Secondary | ICD-10-CM | POA: Diagnosis not present

## 2013-07-04 DIAGNOSIS — N400 Enlarged prostate without lower urinary tract symptoms: Secondary | ICD-10-CM | POA: Diagnosis not present

## 2013-07-04 DIAGNOSIS — H938X9 Other specified disorders of ear, unspecified ear: Secondary | ICD-10-CM

## 2013-07-04 DIAGNOSIS — IMO0002 Reserved for concepts with insufficient information to code with codable children: Secondary | ICD-10-CM

## 2013-07-04 DIAGNOSIS — Z5181 Encounter for therapeutic drug level monitoring: Secondary | ICD-10-CM

## 2013-07-04 LAB — CBC WITH DIFFERENTIAL/PLATELET
Basophils Absolute: 0 10*3/uL (ref 0.0–0.1)
Basophils Relative: 0.3 % (ref 0.0–3.0)
EOS ABS: 0.1 10*3/uL (ref 0.0–0.7)
Eosinophils Relative: 1.5 % (ref 0.0–5.0)
HCT: 43 % (ref 39.0–52.0)
Hemoglobin: 14.3 g/dL (ref 13.0–17.0)
Lymphocytes Relative: 14.8 % (ref 12.0–46.0)
Lymphs Abs: 1 10*3/uL (ref 0.7–4.0)
MCHC: 33.3 g/dL (ref 30.0–36.0)
MCV: 91.1 fl (ref 78.0–100.0)
Monocytes Absolute: 0.5 10*3/uL (ref 0.1–1.0)
Monocytes Relative: 8 % (ref 3.0–12.0)
Neutro Abs: 4.9 10*3/uL (ref 1.4–7.7)
Neutrophils Relative %: 75.4 % (ref 43.0–77.0)
PLATELETS: 184 10*3/uL (ref 150.0–400.0)
RBC: 4.72 Mil/uL (ref 4.22–5.81)
RDW: 15.1 % — AB (ref 11.5–14.6)
WBC: 6.5 10*3/uL (ref 4.5–10.5)

## 2013-07-04 LAB — BASIC METABOLIC PANEL
BUN: 12 mg/dL (ref 6–23)
CALCIUM: 9.2 mg/dL (ref 8.4–10.5)
CO2: 30 mEq/L (ref 19–32)
Chloride: 96 mEq/L (ref 96–112)
Creatinine, Ser: 1 mg/dL (ref 0.4–1.5)
GFR: 80.22 mL/min (ref >60.00)
GLUCOSE: 110 mg/dL — AB (ref 70–99)
Potassium: 4.6 mEq/L (ref 3.5–5.1)
Sodium: 131 mEq/L — ABNORMAL LOW (ref 135–145)

## 2013-07-04 LAB — TSH: TSH: 1.72 u[IU]/mL (ref 0.35–5.50)

## 2013-07-04 LAB — POCT INR: INR: 2

## 2013-07-04 LAB — PSA: PSA: 3.89 ng/mL (ref 0.10–4.00)

## 2013-07-04 NOTE — Progress Notes (Signed)
Pre visit review using our clinic review tool, if applicable. No additional management support is needed unless otherwise documented below in the visit note. 

## 2013-07-05 DIAGNOSIS — H903 Sensorineural hearing loss, bilateral: Secondary | ICD-10-CM | POA: Diagnosis not present

## 2013-08-07 DIAGNOSIS — H35379 Puckering of macula, unspecified eye: Secondary | ICD-10-CM | POA: Diagnosis not present

## 2013-08-07 DIAGNOSIS — H40019 Open angle with borderline findings, low risk, unspecified eye: Secondary | ICD-10-CM | POA: Diagnosis not present

## 2013-08-07 DIAGNOSIS — H35349 Macular cyst, hole, or pseudohole, unspecified eye: Secondary | ICD-10-CM | POA: Diagnosis not present

## 2013-08-22 ENCOUNTER — Ambulatory Visit (INDEPENDENT_AMBULATORY_CARE_PROVIDER_SITE_OTHER): Payer: Medicare Other | Admitting: Family Medicine

## 2013-08-22 DIAGNOSIS — I4891 Unspecified atrial fibrillation: Secondary | ICD-10-CM | POA: Diagnosis not present

## 2013-08-22 DIAGNOSIS — Z5181 Encounter for therapeutic drug level monitoring: Secondary | ICD-10-CM

## 2013-08-22 DIAGNOSIS — Z7901 Long term (current) use of anticoagulants: Secondary | ICD-10-CM | POA: Diagnosis not present

## 2013-08-22 LAB — POCT INR: INR: 2.5

## 2013-09-08 ENCOUNTER — Ambulatory Visit (INDEPENDENT_AMBULATORY_CARE_PROVIDER_SITE_OTHER): Payer: Medicare Other | Admitting: Internal Medicine

## 2013-09-08 ENCOUNTER — Encounter: Payer: Self-pay | Admitting: Internal Medicine

## 2013-09-08 VITALS — BP 126/84 | HR 74 | Temp 97.7°F | Wt 204.6 lb

## 2013-09-08 DIAGNOSIS — M545 Low back pain, unspecified: Secondary | ICD-10-CM

## 2013-09-08 MED ORDER — METHOCARBAMOL 500 MG PO TABS: ORAL_TABLET | ORAL | Status: DC

## 2013-09-08 MED ORDER — TRAMADOL HCL 50 MG PO TABS: 50.0000 mg | ORAL_TABLET | Freq: Three times a day (TID) | ORAL | Status: DC | PRN

## 2013-09-08 NOTE — Progress Notes (Signed)
   Subjective:    Patient ID: Colin Miller, male    DOB: April 26, 1926, 80 y.o.   MRN: [id number]  HPI He hit a bucket of golf balls 08/29/13; he had not played golf in 5 years. He awoke the next morning with severe pain in his waist and hips, described as sharp and constant. There was some radiation into the right buttock. Tylenol was of no benefit.  Sitting is not associated with pain but it is initiated as soon as he stands.  He had a history of disc surgery in 1960. He's had occasional low back pain intermittently since.  He has a history of neuropathy in the left lower extremity and wears a brace for footdrop     Review of Systems  He denies fever, chills, sweats, or unexplained weight loss  He has had no visual dysfunction  He also denies any abdominal pain, melena, or rectal bleeding.  He has no dysuria, hematuria, or pyuria  He denies weakness in the legs or urine/stool incontinence     Objective:   Physical Exam  He appears well-nourished and in no acute distress  Sclerae are clear without icterus. No conjunctivitis present  He has no lymphadenopathy about the neck or axilla  His is barrel chested  Atrial fibrillation clinically with flow murmur  Abdomen is protuberant. There is no organomegaly or tenderness  There is profound or lordosis of the mid-lower thoracic spine  Fusiform changes the knees are noted with decreased range of motion bilaterally with crepitus  Right knee reflexes 1+; left is absent.  The left lower extremity is in a brace.  Pedal pulses are present and equal  Gait is broad; he uses a cane        Assessment & Plan:  #1 acute low back pain secondary to hitting a bucket of golf balls with repetitive spinal rotation  #2 foot drop left lower extremity, chronic  See orders

## 2013-09-08 NOTE — Progress Notes (Signed)
Pre visit review using our clinic review tool, if applicable. No additional management support is needed unless otherwise documented below in the visit note. 

## 2013-09-08 NOTE — Patient Instructions (Signed)
If your symptoms persist or progress; I would recommend referral to Physical Therapy

## 2013-09-12 DIAGNOSIS — H348392 Tributary (branch) retinal vein occlusion, unspecified eye, stable: Secondary | ICD-10-CM | POA: Diagnosis not present

## 2013-09-12 DIAGNOSIS — H35379 Puckering of macula, unspecified eye: Secondary | ICD-10-CM | POA: Diagnosis not present

## 2013-10-03 ENCOUNTER — Ambulatory Visit (INDEPENDENT_AMBULATORY_CARE_PROVIDER_SITE_OTHER): Payer: Medicare Other | Admitting: Family Medicine

## 2013-10-03 DIAGNOSIS — Z5181 Encounter for therapeutic drug level monitoring: Secondary | ICD-10-CM | POA: Diagnosis not present

## 2013-10-03 DIAGNOSIS — I4891 Unspecified atrial fibrillation: Secondary | ICD-10-CM

## 2013-10-03 DIAGNOSIS — Z7901 Long term (current) use of anticoagulants: Secondary | ICD-10-CM

## 2013-10-03 DIAGNOSIS — I482 Chronic atrial fibrillation, unspecified: Secondary | ICD-10-CM

## 2013-10-03 LAB — POCT INR: INR: 2.2

## 2013-11-14 ENCOUNTER — Ambulatory Visit (INDEPENDENT_AMBULATORY_CARE_PROVIDER_SITE_OTHER): Payer: Medicare Other | Admitting: Family Medicine

## 2013-11-14 DIAGNOSIS — Z7901 Long term (current) use of anticoagulants: Secondary | ICD-10-CM | POA: Diagnosis not present

## 2013-11-14 DIAGNOSIS — Z5181 Encounter for therapeutic drug level monitoring: Secondary | ICD-10-CM

## 2013-11-14 LAB — POCT INR: INR: 2

## 2013-12-05 DIAGNOSIS — N401 Enlarged prostate with lower urinary tract symptoms: Secondary | ICD-10-CM | POA: Diagnosis not present

## 2013-12-05 DIAGNOSIS — E291 Testicular hypofunction: Secondary | ICD-10-CM | POA: Diagnosis not present

## 2013-12-05 DIAGNOSIS — N500 Atrophy of testis: Secondary | ICD-10-CM | POA: Diagnosis not present

## 2013-12-07 ENCOUNTER — Telehealth: Payer: Self-pay | Admitting: Internal Medicine

## 2013-12-07 DIAGNOSIS — E291 Testicular hypofunction: Secondary | ICD-10-CM | POA: Diagnosis not present

## 2013-12-07 NOTE — Telephone Encounter (Signed)
Rec'd from [facility] forward 5 pages to [facility]

## 2014-01-02 ENCOUNTER — Ambulatory Visit (INDEPENDENT_AMBULATORY_CARE_PROVIDER_SITE_OTHER): Payer: Medicare Other | Admitting: *Deleted

## 2014-01-02 DIAGNOSIS — Z7901 Long term (current) use of anticoagulants: Secondary | ICD-10-CM | POA: Diagnosis not present

## 2014-01-02 DIAGNOSIS — Z5181 Encounter for therapeutic drug level monitoring: Secondary | ICD-10-CM | POA: Diagnosis not present

## 2014-01-02 DIAGNOSIS — Z23 Encounter for immunization: Secondary | ICD-10-CM

## 2014-01-02 LAB — POCT INR: INR: 3

## 2014-02-05 DIAGNOSIS — H35372 Puckering of macula, left eye: Secondary | ICD-10-CM | POA: Diagnosis not present

## 2014-02-14 ENCOUNTER — Ambulatory Visit (INDEPENDENT_AMBULATORY_CARE_PROVIDER_SITE_OTHER): Payer: Medicare Other | Admitting: Family

## 2014-02-14 DIAGNOSIS — Z5181 Encounter for therapeutic drug level monitoring: Secondary | ICD-10-CM

## 2014-02-14 LAB — POCT INR: INR: 2.9

## 2014-02-14 NOTE — Patient Instructions (Signed)
Take 5 mg everyday and re-check in 6 weeks.  Anticoagulation Dose Instructions as of 02/14/2014      Glynis SmilesSun Mon Tue Wed Thu Fri Sat   New Dose 5 mg 5 mg 5 mg 5 mg 5 mg 5 mg 5 mg    Description        Take 5 mg everyday and re-check in 6 weeks.

## 2014-03-22 ENCOUNTER — Ambulatory Visit (INDEPENDENT_AMBULATORY_CARE_PROVIDER_SITE_OTHER): Payer: Medicare Other | Admitting: Internal Medicine

## 2014-03-22 ENCOUNTER — Encounter: Payer: Self-pay | Admitting: Internal Medicine

## 2014-03-22 VITALS — BP 146/80 | HR 75 | Temp 97.6°F | Wt 210.2 lb

## 2014-03-22 DIAGNOSIS — I482 Chronic atrial fibrillation, unspecified: Secondary | ICD-10-CM

## 2014-03-22 DIAGNOSIS — K4090 Unilateral inguinal hernia, without obstruction or gangrene, not specified as recurrent: Secondary | ICD-10-CM | POA: Diagnosis not present

## 2014-03-22 NOTE — Progress Notes (Signed)
   Subjective:    Patient ID: Colin RiffleBobby Miller, male    DOB: 09/04/1926, 80 y.o.   MRN: [id number]  HPI    For several days he describes intermittent twinges of pain in the left inguinal area.  There was no specific triggers such as lifting; but he did strain trying to find something under his couch  Additionally he must strain  because his stools are hard. He does take a stool softener  His concern is that his wife had had an incarcerated hernia in the past     Review of Systems    He denies fever, chills, sweats, weight loss  He denies any genitourinary symptoms such as hematuria, dysuria, or pyuria  He also has no diarrhea, melena, rectal bleeding.       Objective:   Physical Exam  Pertinent or positive findings include: He appears younger than stated age Ptosis OS He has pattern alopecia Heart is irregular w/o murmur. He has fusiform enlargement of the knees with crepitus bilaterally There is a small direct hernia on the left. There is also a left varicele.  General appearance :adequately nourished; in no distress. Eyes: No conjunctival inflammation or scleral icterus is present. Lungs:Chest clear to auscultation; no wheezes, rhonchi,rales ,or rubs present.No increased work of breathing.  Abdomen: bowel sounds normal, soft and non-tender without masses, organomegaly or hernias noted.  No guarding or rebound. No flank tenderness to percussion. Vascular : all pulses equal ; no bruits present. Skin:Warm & dry.  Intact without suspicious lesions or rashes ; no jaundice or tenting Lymphatic: No lymphadenopathy is noted about the head, neck, axilla, or inguinal areas.          Assessment & Plan:  #1 small direct hernia on the left #2 AF , on warfarin  Plan: Surgical consultation

## 2014-03-22 NOTE — Progress Notes (Signed)
Pre visit review using our clinic review tool, if applicable. No additional management support is needed unless otherwise documented below in the visit note. 

## 2014-03-22 NOTE — Patient Instructions (Signed)
The General Surgery referral will be scheduled and you'll be notified of the time.Please call the Referral Co-Ordinator @ [phone] if you have not been notified of appointment time within 7-10 days.

## 2014-03-28 ENCOUNTER — Ambulatory Visit (INDEPENDENT_AMBULATORY_CARE_PROVIDER_SITE_OTHER): Payer: Medicare Other

## 2014-03-28 DIAGNOSIS — Z5181 Encounter for therapeutic drug level monitoring: Secondary | ICD-10-CM

## 2014-03-28 LAB — POCT INR: INR: 2.1

## 2014-03-31 DIAGNOSIS — I10 Essential (primary) hypertension: Secondary | ICD-10-CM | POA: Diagnosis not present

## 2014-03-31 DIAGNOSIS — S61052A Open bite of left thumb without damage to nail, initial encounter: Secondary | ICD-10-CM | POA: Diagnosis not present

## 2014-03-31 DIAGNOSIS — R5382 Chronic fatigue, unspecified: Secondary | ICD-10-CM | POA: Diagnosis not present

## 2014-04-07 ENCOUNTER — Encounter (HOSPITAL_COMMUNITY): Payer: Self-pay | Admitting: Emergency Medicine

## 2014-04-07 ENCOUNTER — Emergency Department (HOSPITAL_COMMUNITY)
Admission: EM | Admit: 2014-04-07 | Discharge: 2014-04-08 | Disposition: A | Discharge status: Home / Self Care | Payer: Medicare Other | Attending: Emergency Medicine | Admitting: Emergency Medicine

## 2014-04-07 DIAGNOSIS — Z8719 Personal history of other diseases of the digestive system: Secondary | ICD-10-CM | POA: Diagnosis not present

## 2014-04-07 DIAGNOSIS — Z8669 Personal history of other diseases of the nervous system and sense organs: Secondary | ICD-10-CM | POA: Diagnosis not present

## 2014-04-07 DIAGNOSIS — H1132 Conjunctival hemorrhage, left eye: Secondary | ICD-10-CM | POA: Diagnosis not present

## 2014-04-07 DIAGNOSIS — Y92008 Other place in unspecified non-institutional (private) residence as the place of occurrence of the external cause: Secondary | ICD-10-CM | POA: Insufficient documentation

## 2014-04-07 DIAGNOSIS — W01198A Fall on same level from slipping, tripping and stumbling with subsequent striking against other object, initial encounter: Secondary | ICD-10-CM | POA: Diagnosis not present

## 2014-04-07 DIAGNOSIS — Y9389 Activity, other specified: Secondary | ICD-10-CM | POA: Insufficient documentation

## 2014-04-07 DIAGNOSIS — S0292XA Unspecified fracture of facial bones, initial encounter for closed fracture: Secondary | ICD-10-CM | POA: Diagnosis not present

## 2014-04-07 DIAGNOSIS — R519 Headache, unspecified: Secondary | ICD-10-CM

## 2014-04-07 DIAGNOSIS — Z86010 Personal history of colonic polyps: Secondary | ICD-10-CM | POA: Insufficient documentation

## 2014-04-07 DIAGNOSIS — Z79899 Other long term (current) drug therapy: Secondary | ICD-10-CM | POA: Insufficient documentation

## 2014-04-07 DIAGNOSIS — S02401A Maxillary fracture, unspecified, initial encounter for closed fracture: Secondary | ICD-10-CM | POA: Insufficient documentation

## 2014-04-07 DIAGNOSIS — Z7901 Long term (current) use of anticoagulants: Secondary | ICD-10-CM | POA: Diagnosis not present

## 2014-04-07 DIAGNOSIS — R51 Headache: Secondary | ICD-10-CM | POA: Diagnosis not present

## 2014-04-07 DIAGNOSIS — W19XXXA Unspecified fall, initial encounter: Secondary | ICD-10-CM

## 2014-04-07 DIAGNOSIS — R011 Cardiac murmur, unspecified: Secondary | ICD-10-CM | POA: Insufficient documentation

## 2014-04-07 DIAGNOSIS — S0993XA Unspecified injury of face, initial encounter: Secondary | ICD-10-CM | POA: Diagnosis present

## 2014-04-07 DIAGNOSIS — Z8619 Personal history of other infectious and parasitic diseases: Secondary | ICD-10-CM | POA: Insufficient documentation

## 2014-04-07 DIAGNOSIS — M1990 Unspecified osteoarthritis, unspecified site: Secondary | ICD-10-CM | POA: Insufficient documentation

## 2014-04-07 DIAGNOSIS — Y998 Other external cause status: Secondary | ICD-10-CM | POA: Diagnosis not present

## 2014-04-07 DIAGNOSIS — S0990XA Unspecified injury of head, initial encounter: Secondary | ICD-10-CM | POA: Diagnosis not present

## 2014-04-07 NOTE — ED Notes (Signed)
Pt reports that he fell last Saturday, in the middle of the night, and was seen at Urgent Care for Facial pain. Pt also had stitches placed to his thumb from an injury from his fall. Pt states that he has continued to have blood drainage from his nose, with nasal congestion. Pt states that his L upper tooth still hurts from the fall. Pt does take Warfarin. Pt a&ox4, skin warm and dry, ambulatory to triage.

## 2014-04-08 ENCOUNTER — Emergency Department (HOSPITAL_COMMUNITY): Payer: Medicare Other

## 2014-04-08 DIAGNOSIS — S0990XA Unspecified injury of head, initial encounter: Secondary | ICD-10-CM | POA: Diagnosis not present

## 2014-04-08 DIAGNOSIS — S02401A Maxillary fracture, unspecified, initial encounter for closed fracture: Secondary | ICD-10-CM | POA: Diagnosis not present

## 2014-04-08 LAB — PROTIME-INR
INR: 2.76 — ABNORMAL HIGH (ref 0.00–1.49)
Prothrombin Time: 29.4 seconds — ABNORMAL HIGH (ref 11.6–15.2)

## 2014-04-08 MED ORDER — HYDROCODONE-ACETAMINOPHEN 5-325 MG PO TABS
1.0000 | ORAL_TABLET | Freq: Once | ORAL | Status: AC
  Administered 2014-04-08: 1 via ORAL
  Filled 2014-04-08: qty 1

## 2014-04-08 MED ORDER — HYDROCODONE-ACETAMINOPHEN 5-325 MG PO TABS: 1.0000 | ORAL_TABLET | ORAL | Status: DC | PRN

## 2014-04-08 MED ORDER — CEPHALEXIN 500 MG PO CAPS: 500.0000 mg | ORAL_CAPSULE | Freq: Four times a day (QID) | ORAL | Status: DC

## 2014-04-08 NOTE — ED Provider Notes (Signed)
CSN: [id number]     Arrival date & time 04/07/14  2212 History   First MD Initiated Contact with Patient 04/08/14 0145     Chief Complaint  Patient presents with  . Fall     (Consider location/radiation/quality/duration/timing/severity/associated sxs/prior Treatment) Patient is a 80 y.o. male presenting with fall. The history is provided by the patient and the spouse. No language interpreter was used.  Fall The current episode started in the past 7 days. The problem occurs constantly. Associated symptoms include congestion. Pertinent negatives include no chills, fever or headaches. Associated symptoms comments: The patient fell while at home one week ago, striking his face on a table. No LOC. He was seen at Urgent Care and discharged home with laceration repair to finger. He reports tonight with complaint of continued left facial pain and new subconjunctival hemorrhage of the left eye. He has bloody nasal drainage from the left nostril. He denies headache, vomiting. .    Past Medical History  Diagnosis Date  . Arthritis   . Chicken pox   . Diverticulitis   . Glaucoma   . Allergy   . Heart murmur   . Colon polyps    Past Surgical History  Procedure Laterality Date  . Appendectomy    . Back disc     Family History  Problem Relation Age of Onset  . Stroke Other   . Heart disease Father   . Cancer Brother     lung, brain tumor  . Diabetes Neg Hx    History  Substance Use Topics  . Smoking status: Never Smoker   . Smokeless tobacco: Not on file  . Alcohol Use: Yes    Review of Systems  Constitutional: Negative for fever and chills.  HENT: Positive for congestion and sinus pressure.   Eyes:       See HPI.  Respiratory: Negative.   Cardiovascular: Negative.   Gastrointestinal: Negative.   Musculoskeletal: Negative.   Skin: Negative.   Neurological: Negative.  Negative for syncope and headaches.      Allergies  Review of patient's allergies indicates no known  allergies.  Home Medications   Prior to Admission medications   Medication Sig Start Date End Date Taking? Authorizing Provider  acetaminophen (TYLENOL) 325 MG tablet Take 500 mg by mouth every 6 (six) hours as needed for mild pain.    Yes Historical Provider, MD  atenolol (TENORMIN) 25 MG tablet Take 1 tablet (25 mg total) by mouth daily. 07/03/13  Yes Baltazar ApoNancy Hartman, PA-C  bisacodyl (DULCOLAX) 5 MG EC tablet Take 5 mg by mouth daily as needed for moderate constipation.   Yes Historical Provider, MD  finasteride (PROSCAR) 5 MG tablet Take 5 mg by mouth daily.   Yes Historical Provider, MD  Multiple Vitamins-Minerals (ICAPS) CAPS Take 2 capsules by mouth 2 (two) times daily.    Yes Historical Provider, MD  tamsulosin (FLOMAX) 0.4 MG CAPS capsule Take 1 capsule (0.4 mg total) by mouth daily. Patient taking differently: Take 0.8 mg by mouth daily.  07/03/13  Yes Baltazar ApoNancy Hartman, PA-C  warfarin (COUMADIN) 5 MG tablet Take 1 tablet (5 mg total) by mouth daily. 07/03/13  Yes Baltazar ApoNancy Hartman, PA-C  methocarbamol (ROBAXIN) 500 MG tablet 1 qhs prn Patient not taking: Reported on 03/22/2014 09/08/13   Pecola LawlessWilliam F Hopper, MD   BP 141/98 mmHg  Pulse 93  Temp(Src) 98 F (36.7 C) (Oral)  Resp 18  Wt 202 lb (91.627 kg)  SpO2 97% Physical Exam  Constitutional:  He is oriented to person, place, and time. He appears well-developed and well-nourished.  HENT:  Head: Normocephalic.  Left facial tenderness to palpation, predominantly over zygomatic arch extending to left nose. No bony deformities.   Eyes:  Left subconjunctival hemorrhage. Full, pain-free range ocular motion.   Neck: Normal range of motion. Neck supple.  Cardiovascular: Normal rate and regular rhythm.   Pulmonary/Chest: Effort normal and breath sounds normal. He exhibits no tenderness.  Abdominal: Soft. Bowel sounds are normal. There is no tenderness. There is no rebound and no guarding.  Musculoskeletal: Normal range of motion.  Neurological: He  is alert and oriented to person, place, and time.  Skin: Skin is warm and dry. No rash noted.  Psychiatric: He has a normal mood and affect.    ED Course  Procedures (including critical care time) Labs Review Labs Reviewed  PROTIME-INR - Abnormal; Notable for the following:    Prothrombin Time 29.4 (*)    INR 2.76 (*)    All other components within normal limits    Imaging Review Ct Head Wo Contrast  04/08/2014   CLINICAL DATA:  Acute onset of facial pain, status post fall 1 week ago. Epistaxis and nasal congestion. Left upper tooth pain. Initial encounter.  EXAM: CT HEAD WITHOUT CONTRAST  CT MAXILLOFACIAL WITHOUT CONTRAST  TECHNIQUE: Multidetector CT imaging of the head and maxillofacial structures were performed using the standard protocol without intravenous contrast. Multiplanar CT image reconstructions of the maxillofacial structures were also generated.  COMPARISON:  None.  FINDINGS: CT HEAD FINDINGS  There is no evidence of acute infarction, mass lesion, or intra- or extra-axial hemorrhage on CT.  Prominence of the ventricles and sulci reflects mild cortical volume loss. Mild periventricular and subcortical white matter change likely reflects small vessel ischemic microangiopathy. Cerebellar atrophy is noted.  The brainstem and fourth ventricle are within normal limits. The basal ganglia are unremarkable in appearance. The cerebral hemispheres demonstrate grossly normal gray-white differentiation. No mass effect or midline shift is seen.  There is a mildly displaced and comminuted fracture involving the lateral wall of the left maxillary sinus, with blood seen filling the left maxillary sinus. The orbits are within normal limits. The remaining paranasal sinuses and mastoid air cells are well-aerated. No significant soft tissue abnormalities are seen.  CT MAXILLOFACIAL FINDINGS  There is a mildly displaced and comminuted fracture involving the lateral wall of the left maxillary sinus, with  blood seen filling the left maxillary sinus. No additional fractures are seen. The mandible appears intact. The nasal bone is unremarkable in appearance. The visualized dentition demonstrates no acute abnormality. There is no definite evidence of loosening of visualized teeth.  The orbits appear intact bilaterally. The remaining visualized paranasal sinuses and mastoid air cells are well-aerated.  No significant soft tissue abnormalities are seen. The parapharyngeal fat planes are preserved. The nasopharynx, oropharynx and hypopharynx are unremarkable in appearance. The visualized portions of the valleculae and piriform sinuses are grossly unremarkable.  The parotid and submandibular glands are within normal limits. No cervical lymphadenopathy is seen.  IMPRESSION: 1. No evidence of traumatic intracranial injury. 2. Mildly displaced comminuted fracture involving the lateral wall of the left maxillary sinus, with blood seen filling the left maxillary sinus. No additional fractures seen. 3. Mild cortical volume loss and scattered small vessel ischemic microangiopathy.   Electronically Signed   By: Roanna Raider M.D.   On: 04/08/2014 03:53   Ct Maxillofacial Wo Cm  04/08/2014   CLINICAL DATA:  Acute  onset of facial pain, status post fall 1 week ago. Epistaxis and nasal congestion. Left upper tooth pain. Initial encounter.  EXAM: CT HEAD WITHOUT CONTRAST  CT MAXILLOFACIAL WITHOUT CONTRAST  TECHNIQUE: Multidetector CT imaging of the head and maxillofacial structures were performed using the standard protocol without intravenous contrast. Multiplanar CT image reconstructions of the maxillofacial structures were also generated.  COMPARISON:  None.  FINDINGS: CT HEAD FINDINGS  There is no evidence of acute infarction, mass lesion, or intra- or extra-axial hemorrhage on CT.  Prominence of the ventricles and sulci reflects mild cortical volume loss. Mild periventricular and subcortical white matter change likely reflects  small vessel ischemic microangiopathy. Cerebellar atrophy is noted.  The brainstem and fourth ventricle are within normal limits. The basal ganglia are unremarkable in appearance. The cerebral hemispheres demonstrate grossly normal gray-white differentiation. No mass effect or midline shift is seen.  There is a mildly displaced and comminuted fracture involving the lateral wall of the left maxillary sinus, with blood seen filling the left maxillary sinus. The orbits are within normal limits. The remaining paranasal sinuses and mastoid air cells are well-aerated. No significant soft tissue abnormalities are seen.  CT MAXILLOFACIAL FINDINGS  There is a mildly displaced and comminuted fracture involving the lateral wall of the left maxillary sinus, with blood seen filling the left maxillary sinus. No additional fractures are seen. The mandible appears intact. The nasal bone is unremarkable in appearance. The visualized dentition demonstrates no acute abnormality. There is no definite evidence of loosening of visualized teeth.  The orbits appear intact bilaterally. The remaining visualized paranasal sinuses and mastoid air cells are well-aerated.  No significant soft tissue abnormalities are seen. The parapharyngeal fat planes are preserved. The nasopharynx, oropharynx and hypopharynx are unremarkable in appearance. The visualized portions of the valleculae and piriform sinuses are grossly unremarkable.  The parotid and submandibular glands are within normal limits. No cervical lymphadenopathy is seen.  IMPRESSION: 1. No evidence of traumatic intracranial injury. 2. Mildly displaced comminuted fracture involving the lateral wall of the left maxillary sinus, with blood seen filling the left maxillary sinus. No additional fractures seen. 3. Mild cortical volume loss and scattered small vessel ischemic microangiopathy.   Electronically Signed   By: Roanna RaiderJeffery  Chang M.D.   On: 04/08/2014 03:53     EKG  Interpretation None      MDM   Final diagnoses:  Facial pain    1. Facial fracture 2. Coagulopathy secondary to Warfarin  The patient presents with facial injury occurring one week ago. VSS. He has a fracture of the left maxillary sinus, mildly displaced c/w pain of complaint. Discussed with Dr. Mora Bellmanni. Feel he can be discharged home with ENT follow up. Pain management provided.    Arnoldo HookerShari A Demeka Sutter, PA-C 04/08/14 [id number]  Tomasita CrumbleAdeleke Oni, MD 04/08/14 [phone]

## 2014-04-08 NOTE — Discharge Instructions (Signed)
Facial Fracture °A facial fracture is a break in one of the bones of your face. °HOME CARE INSTRUCTIONS  °· Protect the injured part of your face until it is healed. °· Do not participate in activities which give chance for re-injury until your doctor approves. °· Gently wash and dry your face. °· Wear head and facial protection while riding a bicycle, motorcycle, or snowmobile. °SEEK MEDICAL CARE IF:  °· An oral temperature above 102° F (38.9° C) develops. °· You have severe headaches or notice changes in your vision. °· You have new numbness or tingling in your face. °· You develop nausea (feeling sick to your stomach), vomiting or a stiff neck. °SEEK IMMEDIATE MEDICAL CARE IF:  °· You develop difficulty seeing or experience double vision. °· You become dizzy, lightheaded, or faint. °· You develop trouble speaking, breathing, or swallowing. °· You have a watery discharge from your nose or ear. °MAKE SURE YOU:  °· Understand these instructions. °· Will watch your condition. °· Will get help right away if you are not doing well or get worse. °Document Released: 03/30/2005 Document Revised: 06/22/2011 Document Reviewed: 11/17/2007 °ExitCare® Patient Information ©2015 [employer]. This information is not intended to replace advice given to you by your health care provider. Make sure you discuss any questions you have with your health care provider. ° ° ° °

## 2014-04-10 ENCOUNTER — Telehealth: Payer: Self-pay | Admitting: Internal Medicine

## 2014-04-10 DIAGNOSIS — N401 Enlarged prostate with lower urinary tract symptoms: Secondary | ICD-10-CM | POA: Diagnosis not present

## 2014-04-10 DIAGNOSIS — N138 Other obstructive and reflux uropathy: Secondary | ICD-10-CM | POA: Diagnosis not present

## 2014-04-10 DIAGNOSIS — R350 Frequency of micturition: Secondary | ICD-10-CM | POA: Diagnosis not present

## 2014-04-10 NOTE — Telephone Encounter (Signed)
Pt came by stating he had recently fallen and had broken his cheek bone and was prescribed Cephalexin 500 mg and he is concerned about it affecting his coumadin.  Also, [place] and is wondering if he can get standing orders to take with him so he can get his coumadin checked while temporarily staying there.  Please advise.

## 2014-04-10 NOTE — Telephone Encounter (Signed)
PT/INR OK 12/27; needs recheck 01/4 pre leaving [facility] [facility] [place] [facility] [place]

## 2014-04-10 NOTE — Telephone Encounter (Signed)
Pt scheduled to check INR on 04/18/14.

## 2014-04-11 ENCOUNTER — Ambulatory Visit (INDEPENDENT_AMBULATORY_CARE_PROVIDER_SITE_OTHER): Payer: Medicare Other | Admitting: Family Medicine

## 2014-04-11 DIAGNOSIS — Z5181 Encounter for therapeutic drug level monitoring: Secondary | ICD-10-CM | POA: Diagnosis not present

## 2014-04-11 LAB — POCT INR: INR: 3.2

## 2014-04-12 DIAGNOSIS — H35373 Puckering of macula, bilateral: Secondary | ICD-10-CM | POA: Diagnosis not present

## 2014-04-12 DIAGNOSIS — H3531 Nonexudative age-related macular degeneration: Secondary | ICD-10-CM | POA: Diagnosis not present

## 2014-04-24 DIAGNOSIS — K4090 Unilateral inguinal hernia, without obstruction or gangrene, not specified as recurrent: Secondary | ICD-10-CM | POA: Diagnosis not present

## 2014-05-09 ENCOUNTER — Ambulatory Visit (INDEPENDENT_AMBULATORY_CARE_PROVIDER_SITE_OTHER): Payer: Medicare Other

## 2014-05-09 DIAGNOSIS — Z5181 Encounter for therapeutic drug level monitoring: Secondary | ICD-10-CM

## 2014-05-09 LAB — POCT INR: INR: 2.5

## 2014-05-25 ENCOUNTER — Telehealth: Payer: Self-pay | Admitting: Physician Assistant

## 2014-05-30 ENCOUNTER — Other Ambulatory Visit: Payer: Self-pay | Admitting: Geriatric Medicine

## 2014-05-30 MED ORDER — ATENOLOL 25 MG PO TABS: 25.0000 mg | ORAL_TABLET | Freq: Every day | ORAL | Status: DC

## 2014-05-30 NOTE — Telephone Encounter (Signed)
Pt called and requested refill for  atenolol (TENORMIN) 25 MG tablet [717]       atenolol (TENORMIN) 25 MG tablet [[id number]][[id number]]      He is transfer to Dr Dorise Hisskollar on 3/1, but he out of this medication.  Please send to [facility] if this is able to be refilled

## 2014-05-30 NOTE — Telephone Encounter (Signed)
Sent one month supply to pharmacy until patient comes for appt on 06/12/14.

## 2014-06-05 ENCOUNTER — Telehealth: Payer: Self-pay | Admitting: Internal Medicine

## 2014-06-05 NOTE — Telephone Encounter (Signed)
Phone call to patient's daughter. She states he changed his mind and is not in [place]. He's still in [place].

## 2014-06-05 NOTE — Telephone Encounter (Signed)
Apparently [place]?  Unless the cardiologist who prescribed the coumadin can provide such instructions he must be seen by physician there to monitor warfarin. This is the Standard of Care mandated by the [facility] [place] [facility].

## 2014-06-05 NOTE — Telephone Encounter (Signed)
Michell Heinrichlice McGowan - patient's daughter  [phone]) needs a release letter for surgery and the letter should contain coumadin instructions for her father.

## 2014-06-05 NOTE — Telephone Encounter (Signed)
He needs to see Bailey MechCindy Boyd for PT/INR update 2/27 Also needs to establish with PCP

## 2014-06-12 ENCOUNTER — Ambulatory Visit (INDEPENDENT_AMBULATORY_CARE_PROVIDER_SITE_OTHER): Payer: Medicare Other | Admitting: General Practice

## 2014-06-12 ENCOUNTER — Encounter: Payer: Self-pay | Admitting: Internal Medicine

## 2014-06-12 ENCOUNTER — Ambulatory Visit (INDEPENDENT_AMBULATORY_CARE_PROVIDER_SITE_OTHER): Payer: Self-pay | Admitting: General Surgery

## 2014-06-12 ENCOUNTER — Ambulatory Visit (INDEPENDENT_AMBULATORY_CARE_PROVIDER_SITE_OTHER): Payer: Medicare Other | Admitting: Internal Medicine

## 2014-06-12 ENCOUNTER — Other Ambulatory Visit (INDEPENDENT_AMBULATORY_CARE_PROVIDER_SITE_OTHER): Payer: Medicare Other

## 2014-06-12 VITALS — BP 142/90 | HR 81 | Temp 97.6°F | Resp 18 | Wt 209.0 lb

## 2014-06-12 DIAGNOSIS — I482 Chronic atrial fibrillation, unspecified: Secondary | ICD-10-CM

## 2014-06-12 DIAGNOSIS — Z0000 Encounter for general adult medical examination without abnormal findings: Secondary | ICD-10-CM | POA: Diagnosis not present

## 2014-06-12 DIAGNOSIS — Z01818 Encounter for other preprocedural examination: Secondary | ICD-10-CM | POA: Insufficient documentation

## 2014-06-12 DIAGNOSIS — E785 Hyperlipidemia, unspecified: Secondary | ICD-10-CM

## 2014-06-12 DIAGNOSIS — Z5181 Encounter for therapeutic drug level monitoring: Secondary | ICD-10-CM | POA: Diagnosis not present

## 2014-06-12 DIAGNOSIS — Z01810 Encounter for preprocedural cardiovascular examination: Secondary | ICD-10-CM

## 2014-06-12 DIAGNOSIS — N400 Enlarged prostate without lower urinary tract symptoms: Secondary | ICD-10-CM | POA: Diagnosis not present

## 2014-06-12 LAB — BASIC METABOLIC PANEL
BUN: 10 mg/dL (ref 6–23)
CALCIUM: 9.3 mg/dL (ref 8.4–10.5)
CO2: 32 meq/L (ref 19–32)
Chloride: 97 mEq/L (ref 96–112)
Creatinine, Ser: 0.86 mg/dL (ref 0.40–1.50)
GFR: 89.78 mL/min (ref >60.00)
Glucose, Bld: 93 mg/dL (ref 70–99)
POTASSIUM: 4.5 meq/L (ref 3.5–5.1)
SODIUM: 131 meq/L — AB (ref 135–145)

## 2014-06-12 LAB — LIPID PANEL
CHOLESTEROL: 150 mg/dL (ref 0–200)
HDL: 52.4 mg/dL (ref >39.00)
LDL Cholesterol: 88 mg/dL (ref 0–99)
NonHDL: 97.6
TRIGLYCERIDES: 49 mg/dL (ref 0.0–149.0)
Total CHOL/HDL Ratio: 3
VLDL: 9.8 mg/dL (ref 0.0–40.0)

## 2014-06-12 LAB — POCT INR: INR: 2.2

## 2014-06-12 NOTE — Progress Notes (Signed)
   Subjective:    Patient ID: Colin Miller, male    DOB: 10/30/1926, 81 y.o.   MRN: [id number]  HPI The patient is an 81 YO man who is here for wellness and pre-op clearance.   Diet: heart healthy Physical activity: sedentary Depression/mood screen: negative Hearing: intact to whispered voice Visual acuity: grossly normal, performs annual eye exam  ADLs: capable Fall risk: low, (tripped once in the last year due to poor lighting) Home safety: good Cognitive evaluation: intact to orientation, naming, recall and repetition EOL planning: adv directives, full code/ I agree  I have personally reviewed and have updated today 1. The patient's medical and social history 2. Their use of alcohol, tobacco or illicit drugs 3. Their current medications and supplements 4. The patient's functional ability including ADL's, fall risks, home safety risks and hearing or visual impairment. 5. Diet and physical activities 6. Evidence for depression or mood disorders 7. Care team reviewed and updated available in snapshot  Review of Systems  Constitutional: Negative for fever, activity change, appetite change, fatigue and unexpected weight change.  HENT: Negative.   Eyes: Negative.   Respiratory: Negative for cough, chest tightness, shortness of breath and wheezing.   Cardiovascular: Negative for chest pain, palpitations and leg swelling.  Gastrointestinal: Negative for nausea, abdominal pain, diarrhea, constipation and abdominal distention.       Inguinal hernia  Musculoskeletal: Positive for arthralgias. Negative for back pain and gait problem.  Skin: Negative.   Neurological: Negative.   Psychiatric/Behavioral: Negative.       Objective:   Physical Exam  Constitutional: He is oriented to person, place, and time. He appears well-developed and well-nourished.  HENT:  Head: Normocephalic and atraumatic.  Eyes: EOM are normal.  Neck: Normal range of motion.  Cardiovascular: Normal rate.     Irregularly irregular  Pulmonary/Chest: Effort normal and breath sounds normal. No respiratory distress. He has no wheezes. He has no rales.  Abdominal: Soft. Bowel sounds are normal. He exhibits no distension. There is no tenderness. There is no rebound.  Musculoskeletal: He exhibits no edema.  Neurological: He is alert and oriented to person, place, and time. Coordination normal.  Skin: Skin is warm and dry.  Psychiatric: He has a normal mood and affect.   Filed Vitals:   06/12/14 1308  BP: 142/90  Pulse: 81  Temp: 97.6 F (36.4 C)  TempSrc: Oral  Resp: 18  Weight: 209 lb (94.802 kg)  SpO2: 97%   EKG interpretation: Atrial fibrillation, rate 80s, intervals normal, no comparison, no st or t wave changes     Assessment & Plan:

## 2014-06-12 NOTE — Progress Notes (Signed)
Pre visit review using our clinic review tool, if applicable. No additional management support is needed unless otherwise documented below in the visit note. 

## 2014-06-12 NOTE — Progress Notes (Signed)
Agree with plan 

## 2014-06-12 NOTE — Assessment & Plan Note (Signed)
Doing well and continue proscar and flomax.

## 2014-06-12 NOTE — Assessment & Plan Note (Signed)
Rate controlled, on coumadin for stroke prevention although his CHADs-2 score is 1 (age) and could be switched to Aspirin in the future if he has problems with bleeding. HR 81 today, goal <110.

## 2014-06-12 NOTE — Patient Instructions (Addendum)
We have cleared you for the surgery and would like you to stop taking the coumadin about 5 days before the surgery. You can start taking it again 1-2 days after the surgery.   We will check your labs today and call you back with the results.   Come back in about 6-12 months for a follow up. If you have any new problems or questions before then please call our office.   Fall Prevention and Home Safety Falls cause injuries and can affect all age groups. It is possible to use preventive measures to significantly decrease the likelihood of falls. There are many simple measures which can make your home safer and prevent falls. OUTDOORS  Repair cracks and edges of walkways and driveways.  Remove high doorway thresholds.  Trim shrubbery on the main path into your home.  Have good outside lighting.  Clear walkways of tools, rocks, debris, and clutter.  Check that handrails are not broken and are securely fastened. Both sides of steps should have handrails.  Have leaves, snow, and ice cleared regularly.  Use sand or salt on walkways during winter months.  In the garage, clean up grease or oil spills. BATHROOM  Install night lights.  Install grab bars by the toilet and in the tub and shower.  Use non-skid mats or decals in the tub or shower.  Place a plastic non-slip stool in the shower to sit on, if needed.  Keep floors dry and clean up all water on the floor immediately.  Remove soap buildup in the tub or shower on a regular basis.  Secure bath mats with non-slip, double-sided rug tape.  Remove throw rugs and tripping hazards from the floors. BEDROOMS  Install night lights.  Make sure a bedside light is easy to reach.  Do not use oversized bedding.  Keep a telephone by your bedside.  Have a firm chair with side arms to use for getting dressed.  Remove throw rugs and tripping hazards from the floor. KITCHEN  Keep handles on pots and pans turned toward the center of  the stove. Use back burners when possible.  Clean up spills quickly and allow time for drying.  Avoid walking on wet floors.  Avoid hot utensils and knives.  Position shelves so they are not too high or low.  Place commonly used objects within easy reach.  If necessary, use a sturdy step stool with a grab bar when reaching.  Keep electrical cables out of the way.  Do not use floor polish or wax that makes floors slippery. If you must use wax, use non-skid floor wax.  Remove throw rugs and tripping hazards from the floor. STAIRWAYS  Never leave objects on stairs.  Place handrails on both sides of stairways and use them. Fix any loose handrails. Make sure handrails on both sides of the stairways are as long as the stairs.  Check carpeting to make sure it is firmly attached along stairs. Make repairs to worn or loose carpet promptly.  Avoid placing throw rugs at the top or bottom of stairways, or properly secure the rug with carpet tape to prevent slippage. Get rid of throw rugs, if possible.  Have an electrician put in a light switch at the top and bottom of the stairs. OTHER FALL PREVENTION TIPS  Wear low-heel or rubber-soled shoes that are supportive and fit well. Wear closed toe shoes.  When using a stepladder, make sure it is fully opened and both spreaders are firmly locked. Do not  climb a closed stepladder.  Add color or contrast paint or tape to grab bars and handrails in your home. Place contrasting color strips on first and last steps.  Learn and use mobility aids as needed. Install an electrical emergency response system.  Turn on lights to avoid dark areas. Replace light bulbs that burn out immediately. Get light switches that glow.  Arrange furniture to create clear pathways. Keep furniture in the same place.  Firmly attach carpet with non-skid or double-sided tape.  Eliminate uneven floor surfaces.  Select a carpet pattern that does not visually hide the  edge of steps.  Be aware of all pets. OTHER HOME SAFETY TIPS  Set the water temperature for 120 F (48.8 C).  Keep emergency numbers on or near the telephone.  Keep smoke detectors on every level of the home and near sleeping areas. Document Released: 03/20/2002 Document Revised: 09/29/2011 Document Reviewed: 06/19/2011 [facility] Patient Information 2015 [place]. This information is not intended to replace advice given to you by your health care provider. Make sure you discuss any questions you have with your health care provider.

## 2014-06-12 NOTE — Assessment & Plan Note (Addendum)
Up to date on shingles, pneumonia, flu shots. He thinks he has had a tetanus shot but not sure what year. Aged out of colon cancer screening and no symptoms to warrant colonoscopy. Non-smoker and is working on exercise (Does not due to pain). Talked to him about home safety and information provided on screening recommendations at today's visit.

## 2014-06-12 NOTE — Assessment & Plan Note (Signed)
Due to relatively low risk of stroke on any individual day okay to hold coumadin 5 days pre-op and resume on POD 1 or 2 (Depending on surgeon's estimate of bleeding risk). Follow up with INR check 1 week after resuming coumadin. EKG checked and reviewed in the office today without problems. Cleared for surgery without other studies. Ordered BMP since none in the last year.

## 2014-06-12 NOTE — H&P (Signed)
vHistory of Present Illness Colin Miller(Colin Susman MD; 04/24/2014 1:39 PM) Patient words: evaluate hernia.  [name] who presents with an inguinal hernia. [name] who is referred by Dr. Alwyn Miller for evaluation of a left inguinal hernia. Patient states that he had some pain to his left inguinal area while sitting down. He does state that yesterday prior to his visit he did have some pain to the left inguinal area. He states otherwise he has been asymptomatic up to this time. Patient states that he has had no signs or symptoms of incarceration or strangulation. Patient does have A. fib and is on Coumadin.   Other Problems Colin Miller(Colin [place], [name]; 04/24/2014 1:29 PM) Arthritis Atrial Fibrillation Back Pain Diverticulosis Enlarged Prostate Heart murmur  Past Surgical History Colin Miller(Colin [place], [name]; 04/24/2014 1:29 PM) Appendectomy Cataract Surgery Bilateral. Colon Polyp Removal - Colonoscopy Knee Surgery Left. Spinal Surgery - Lower Back Vasectomy  Diagnostic Studies History Colin Miller(Colin [place], [name]; 04/24/2014 1:29 PM) Colonoscopy 1-5 years ago  Medication History Colin Miller(Colin [place], [name]; 04/24/2014 1:29 PM) Medications Reconciled  Social History Colin Miller(Colin [place], [name]; 04/24/2014 1:29 PM) Alcohol use Occasional alcohol use. Caffeine use Carbonated beverages, Coffee. No drug use Tobacco use Never smoker.  Family History Colin Miller(Colin [place], [name]; 04/24/2014 1:29 PM) Alcohol Abuse Brother. Cancer Brother. Heart Disease Father. Heart disease in male family member before age 79 Respiratory Condition Brother, Mother.  Review of Systems Colin Miller(Colin ParisMoore MA; 04/24/2014 1:29 PM) General Not Present- Appetite Loss, Chills, Fatigue, Fever, Night Sweats, Weight Gain and Weight Loss. Skin Present- Dryness. Not Present- Change in Wart/Mole, Hives, Jaundice, New Lesions, Non-Healing Wounds, Rash and Ulcer. HEENT Present- Seasonal Allergies, Sinus Pain and Wears  glasses/contact lenses. Not Present- Earache, Hearing Loss, Hoarseness, Nose Bleed, Oral Ulcers, Ringing in the Ears, Sore Throat, Visual Disturbances and Yellow Eyes. Respiratory Not Present- Bloody sputum, Chronic Cough, Difficulty Breathing, Snoring and Wheezing. Breast Not Present- Breast Mass, Breast Pain, Nipple Discharge and Skin Changes. Cardiovascular Present- Shortness of Breath. Not Present- Chest Pain, Difficulty Breathing Lying Down, Leg Cramps, Palpitations, Rapid Heart Rate and Swelling of Extremities. Gastrointestinal Present- Constipation. Not Present- Abdominal Pain, Bloating, Bloody Stool, Change in Bowel Habits, Chronic diarrhea, Difficulty Swallowing, Excessive gas, Gets full quickly at meals, Hemorrhoids, Indigestion, Nausea, Rectal Pain and Vomiting. Male Genitourinary Present- Impotence and Urine Leakage. Not Present- Blood in Urine, Change in Urinary Stream, Frequency, Nocturia, Painful Urination and Urgency. Musculoskeletal Present- Back Pain and Joint Stiffness. Not Present- Joint Pain, Muscle Pain, Muscle Weakness and Swelling of Extremities. Neurological Present- Decreased Memory. Not Present- Fainting, Headaches, Numbness, Seizures, Tingling, Tremor, Trouble walking and Weakness. Psychiatric Present- Anxiety. Not Present- Bipolar, Change in Sleep Pattern, Depression, Fearful and Frequent crying. Endocrine Not Present- Cold Intolerance, Excessive Hunger, Hair Changes, Heat Intolerance, Hot flashes and New Diabetes. Hematology Present- Easy Bruising and Excessive bleeding. Not Present- Gland problems, HIV and Persistent Infections.   Vitals Colin Miller(Colin Moore MA; 04/24/2014 1:29 PM) 04/24/2014 1:28 PM Weight: 199.8 lb Temp.: 97.72F(Temporal)  Pulse: 88 (Regular)  Resp.: 18 (Unlabored)  BP: 124/86 (Sitting, Left Arm, Standard)    Physical Exam Colin Miller(Colin Moger MD; 04/24/2014 1:40 PM) General Mental Status-Alert. General Appearance-Consistent with stated  age. Hydration-Well hydrated. Voice-Normal.  Head and Neck Head-normocephalic, atraumatic with no lesions or palpable masses. Trachea-midline.  Eye Eyeball - Bilateral-Extraocular movements intact. Sclera/Conjunctiva - Bilateral-No scleral icterus.  Chest and Lung Exam Chest and lung exam reveals -quiet, even and easy respiratory effort with no use of  accessory muscles and on auscultation, normal breath sounds, no adventitious sounds and normal vocal resonance. Inspection Chest Wall - Normal. Back - normal.  Cardiovascular Cardiovascular examination reveals -normal heart sounds, regular rate and rhythm with no murmurs and normal pedal pulses bilaterally.  Abdomen Inspection Skin - Scar - no surgical scars. Hernias - Inguinal hernia - Left - Reducible. Right - Reducible. Palpation/Percussion Palpation and Percussion of the abdomen reveal - Soft, Non Tender, No Rebound tenderness, No Rigidity (guarding) and No hepatosplenomegaly. Auscultation Auscultation of the abdomen reveals - Bowel sounds normal.  Neurologic Neurologic evaluation reveals -alert and oriented x 3 with no impairment of recent or remote memory. Mental Status-Normal.  Musculoskeletal Normal Exam - Left-Upper Extremity Strength Normal and Lower Extremity Strength Normal. Normal Exam - Right-Upper Extremity Strength Normal, Lower Extremity Weakness.    Assessment & Plan Colin Miller(Colin Crunk MD; 04/24/2014 1:42 PM) LEFT INGUINAL HERNIA (550.90  K40.90) Impression: 81 year old male with a left inguinal hernia, reducible.  Lap LIHR with mesh All risks and benefits were discussed with the patient, to generally include infection, bleeding, damage to surrounding structures, acute and chronic nerve pain, and recurrence. Alternatives were offered and described.  All questions were answered and the patient voiced understanding of the procedure and wishes to proceed at this point.

## 2014-07-03 ENCOUNTER — Ambulatory Visit (INDEPENDENT_AMBULATORY_CARE_PROVIDER_SITE_OTHER): Payer: Medicare Other | Admitting: *Deleted

## 2014-07-03 DIAGNOSIS — Z23 Encounter for immunization: Secondary | ICD-10-CM | POA: Diagnosis not present

## 2014-07-16 NOTE — Patient Instructions (Addendum)
Colin Miller  07/16/2014   Your procedure is scheduled on: Tuesday 07/24/2014  [facility] and follow signs to               Short Stay Center at 0530 AM.  Call this number if you have problems the morning of surgery [phone]   Remember:  Do not eat food or drink liquids :After Midnight.  Follow Dr. Jacinto Halimamirez's instructions regarding Coumadin     Take these medicines the morning of surgery with A SIP OF WATER: Atenolol, Allegra                               You may not have any metal on your body including hair pins and              piercings  Do not wear jewelry, make-up, lotions, powders or perfumes.             Do not wear nail polish.  Do not shave  48 hours prior to surgery.              Men may shave face and neck.   Do not bring valuables to the hospital. [facility] IS NOT             RESPONSIBLE   FOR VALUABLES.  Contacts, dentures or bridgework may not be worn into surgery.  Leave suitcase in the car. After surgery it may be brought to your room.     Patients discharged the day of surgery will not be allowed to drive home.  Name and phone number of your driver:  Special Instructions: N/A              Please read over the following fact sheets you were given: _____________________________________________________________________             [facility] - Preparing for [facility] Before surgery, you can play an important role.  Because skin is not sterile, your skin needs to be as free of germs as possible.  You can reduce the number of germs on your skin by washing with CHG (chlorahexidine gluconate) soap before surgery.  CHG is an antiseptic cleaner which kills germs and bonds with the skin to continue killing germs even after washing. Please DO NOT use if you have an allergy to CHG or antibacterial soaps.  If your skin becomes reddened/irritated stop using the CHG and inform your nurse when you arrive at Short Stay. Do not  shave (including legs and underarms) for at least 48 hours prior to the first CHG shower.  You may shave your face/neck. Please follow these instructions carefully:  1.  Shower with CHG Soap the night before surgery and the  morning of Surgery.  2.  If you choose to wash your hair, wash your hair first as usual with your  normal  shampoo.  3.  After you shampoo, rinse your hair and body thoroughly to remove the  shampoo.                           4.  Use CHG as you would any other liquid soap.  You can apply chg directly  to the skin and wash  Gently with a scrungie or clean washcloth.  5.  Apply the CHG Soap to your body ONLY FROM THE NECK DOWN.   Do not use on face/ open                           Wound or open sores. Avoid contact with eyes, ears mouth and genitals (private parts).                       Wash face,  Genitals (private parts) with your normal soap.             6.  Wash thoroughly, paying special attention to the area where your surgery  will be performed.  7.  Thoroughly rinse your body with warm water from the neck down.  8.  DO NOT shower/wash with your normal soap after using and rinsing off  the CHG Soap.                9.  Pat yourself dry with a clean towel.            10.  Wear clean pajamas.            11.  Place clean sheets on your bed the night of your first shower and do not  sleep with pets. Day of Surgery : Do not apply any lotions/deodorants the morning of surgery.  Please wear clean clothes to the hospital/surgery center.  FAILURE TO FOLLOW THESE INSTRUCTIONS MAY RESULT IN THE CANCELLATION OF YOUR SURGERY PATIENT SIGNATURE_________________________________  NURSE SIGNATURE__________________________________  ________________________________________________________________________   Colin Miller  An incentive spirometer is a tool that can help keep your lungs clear and active. This tool measures how well you are filling your lungs  with each breath. Taking long deep breaths may help reverse or decrease the chance of developing breathing (pulmonary) problems (especially infection) following:  A long period of time when you are unable to move or be active. BEFORE THE PROCEDURE   If the spirometer includes an indicator to show your best effort, your nurse or respiratory therapist will set it to a desired goal.  If possible, sit up straight or lean slightly forward. Try not to slouch.  Hold the incentive spirometer in an upright position. INSTRUCTIONS FOR USE  1. Sit on the edge of your bed if possible, or sit up as far as you can in bed or on a chair. 2. Hold the incentive spirometer in an upright position. 3. Breathe out normally. 4. Place the mouthpiece in your mouth and seal your lips tightly around it. 5. Breathe in slowly and as deeply as possible, raising the piston or the ball toward the top of the column. 6. Hold your breath for 3-5 seconds or for as long as possible. Allow the piston or ball to fall to the bottom of the column. 7. Remove the mouthpiece from your mouth and breathe out normally. 8. Rest for a few seconds and repeat Steps 1 through 7 at least 10 times every 1-2 hours when you are awake. Take your time and take a few normal breaths between deep breaths. 9. The spirometer may include an indicator to show your best effort. Use the indicator as a goal to work toward during each repetition. 10. After each set of 10 deep breaths, practice coughing to be sure your lungs are clear. If you have an incision (the cut made at the time of surgery),  support your incision when coughing by placing a pillow or rolled up towels firmly against it. Once you are able to get out of bed, walk around indoors and cough well. You may stop using the incentive spirometer when instructed by your caregiver.  RISKS AND COMPLICATIONS  Take your time so you do not get dizzy or light-headed.  If you are in pain, you may need to  take or ask for pain medication before doing incentive spirometry. It is harder to take a deep breath if you are having pain. AFTER USE  Rest and breathe slowly and easily.  It can be helpful to keep track of a log of your progress. Your caregiver can provide you with a simple table to help with this. If you are using the spirometer at home, follow these instructions: [place] IF:   You are having difficultly using the spirometer.  You have trouble using the spirometer as often as instructed.  Your pain medication is not giving enough relief while using the spirometer.  You develop fever of 100.5 F (38.1 C) or higher. SEEK IMMEDIATE MEDICAL CARE IF:   You cough up bloody sputum that had not been present before.  You develop fever of 102 F (38.9 C) or greater.  You develop worsening pain at or near the incision site. MAKE SURE YOU:   Understand these instructions.  Will watch your condition.  Will get help right away if you are not doing well or get worse. Document Released: 08/10/2006 Document Revised: 06/22/2011 Document Reviewed: 10/11/2006 [facility] Patient Information 2014 [place], [place].   ________________________________________________________________________

## 2014-07-18 ENCOUNTER — Encounter (HOSPITAL_COMMUNITY)
Admission: RE | Admit: 2014-07-18 | Discharge: 2014-07-18 | Disposition: A | Discharge status: Home / Self Care | Payer: Medicare Other | Source: Ambulatory Visit | Attending: General Surgery | Admitting: General Surgery

## 2014-07-18 ENCOUNTER — Encounter (HOSPITAL_COMMUNITY): Payer: Self-pay

## 2014-07-18 ENCOUNTER — Other Ambulatory Visit (HOSPITAL_COMMUNITY): Payer: Medicare Other

## 2014-07-18 DIAGNOSIS — I4891 Unspecified atrial fibrillation: Secondary | ICD-10-CM | POA: Diagnosis not present

## 2014-07-18 DIAGNOSIS — Z01812 Encounter for preprocedural laboratory examination: Secondary | ICD-10-CM | POA: Insufficient documentation

## 2014-07-18 HISTORY — DX: Polyneuropathy, unspecified: G62.9

## 2014-07-18 LAB — BASIC METABOLIC PANEL
Anion gap: 8 (ref 5–15)
BUN: 13 mg/dL (ref 6–23)
CHLORIDE: 96 mmol/L (ref 96–112)
CO2: 26 mmol/L (ref 19–32)
Calcium: 9.1 mg/dL (ref 8.4–10.5)
Creatinine, Ser: 0.89 mg/dL (ref 0.50–1.35)
GFR calc Af Amer: 88 mL/min — ABNORMAL LOW (ref >90)
GFR, EST NON AFRICAN AMERICAN: 76 mL/min — AB (ref >90)
GLUCOSE: 87 mg/dL (ref 70–99)
Potassium: 4.6 mmol/L (ref 3.5–5.1)
Sodium: 130 mmol/L — ABNORMAL LOW (ref 135–145)

## 2014-07-18 LAB — CBC
HEMATOCRIT: 41.6 % (ref 39.0–52.0)
Hemoglobin: 13.9 g/dL (ref 13.0–17.0)
MCH: 30.4 pg (ref 26.0–34.0)
MCHC: 33.4 g/dL (ref 30.0–36.0)
MCV: 91 fL (ref 78.0–100.0)
PLATELETS: 183 10*3/uL (ref 150–400)
RBC: 4.57 MIL/uL (ref 4.22–5.81)
RDW: 14.2 % (ref 11.5–15.5)
WBC: 7.1 10*3/uL (ref 4.0–10.5)

## 2014-07-18 LAB — APTT: APTT: 53 s — AB (ref 24–37)

## 2014-07-18 LAB — PROTIME-INR
INR: 1.76 — ABNORMAL HIGH (ref 0.00–1.49)
PROTHROMBIN TIME: 20.6 s — AB (ref 11.6–15.2)

## 2014-07-23 NOTE — Anesthesia Preprocedure Evaluation (Addendum)
Anesthesia Evaluation  Patient identified by MRN, date of birth, ID band Patient awake    Reviewed: Allergy & Precautions, H&P , NPO status , Patient's Chart, lab work & pertinent test results, reviewed documented beta blocker date and time   Airway Mallampati: II  TM Distance: >3 FB Neck ROM: full    Dental  (+) Poor Dentition, Dental Advisory Given Teeth generally poor condition but not missing or loose:   Pulmonary neg pulmonary ROS,  breath sounds clear to auscultation  Pulmonary exam normal       Cardiovascular Exercise Tolerance: Good + dysrhythmias Atrial Fibrillation Rhythm:regular Rate:Normal  On tenormin   Neuro/Psych Neuropathy left leg. glaucoma negative neurological ROS  negative psych ROS   GI/Hepatic negative GI ROS, Neg liver ROS,   Endo/Other  negative endocrine ROS  Renal/GU negative Renal ROS  negative genitourinary   Musculoskeletal   Abdominal   Peds  Hematology negative hematology ROS (+)   Anesthesia Other Findings   Reproductive/Obstetrics negative OB ROS                            Anesthesia Physical Anesthesia Plan  ASA: III  Anesthesia Plan: General   Post-op Pain Management:    Induction: Intravenous  Airway Management Planned: Oral ETT  Additional Equipment:   Intra-op Plan:   Post-operative Plan: Extubation in OR  Informed Consent: I have reviewed the patients History and Physical, chart, labs and discussed the procedure including the risks, benefits and alternatives for the proposed anesthesia with the patient or authorized representative who has indicated his/her understanding and acceptance.   Dental Advisory Given  Plan Discussed with: CRNA and Surgeon  Anesthesia Plan Comments:         Anesthesia Quick Evaluation

## 2014-07-24 ENCOUNTER — Ambulatory Visit (HOSPITAL_COMMUNITY)
Admission: RE | Admit: 2014-07-24 | Discharge: 2014-07-24 | Disposition: A | Discharge status: Home / Self Care | Payer: Medicare Other | Source: Ambulatory Visit | Attending: General Surgery | Admitting: General Surgery

## 2014-07-24 ENCOUNTER — Encounter (HOSPITAL_COMMUNITY): Payer: Self-pay | Admitting: *Deleted

## 2014-07-24 ENCOUNTER — Encounter (HOSPITAL_COMMUNITY)
Admission: RE | Disposition: A | Discharge status: Home / Self Care | Payer: Self-pay | Source: Ambulatory Visit | Attending: General Surgery

## 2014-07-24 ENCOUNTER — Ambulatory Visit (HOSPITAL_COMMUNITY): Payer: Medicare Other | Admitting: Anesthesiology

## 2014-07-24 DIAGNOSIS — H409 Unspecified glaucoma: Secondary | ICD-10-CM | POA: Insufficient documentation

## 2014-07-24 DIAGNOSIS — Z7901 Long term (current) use of anticoagulants: Secondary | ICD-10-CM | POA: Insufficient documentation

## 2014-07-24 DIAGNOSIS — K4090 Unilateral inguinal hernia, without obstruction or gangrene, not specified as recurrent: Secondary | ICD-10-CM | POA: Diagnosis not present

## 2014-07-24 DIAGNOSIS — Z9852 Vasectomy status: Secondary | ICD-10-CM | POA: Insufficient documentation

## 2014-07-24 DIAGNOSIS — M1990 Unspecified osteoarthritis, unspecified site: Secondary | ICD-10-CM | POA: Diagnosis not present

## 2014-07-24 DIAGNOSIS — N400 Enlarged prostate without lower urinary tract symptoms: Secondary | ICD-10-CM | POA: Insufficient documentation

## 2014-07-24 DIAGNOSIS — I4891 Unspecified atrial fibrillation: Secondary | ICD-10-CM | POA: Diagnosis not present

## 2014-07-24 HISTORY — PX: INSERTION OF MESH: SHX5868

## 2014-07-24 HISTORY — PX: INGUINAL HERNIA REPAIR: SHX194

## 2014-07-24 LAB — PROTIME-INR
INR: 1.2 (ref 0.00–1.49)
Prothrombin Time: 15.4 seconds — ABNORMAL HIGH (ref 11.6–15.2)

## 2014-07-24 SURGERY — REPAIR, HERNIA, INGUINAL, LAPAROSCOPIC
Anesthesia: General | Site: Groin | Laterality: Left

## 2014-07-24 MED ORDER — CISATRACURIUM BESYLATE (PF) 10 MG/5ML IV SOLN
INTRAVENOUS | Status: DC | PRN
  Administered 2014-07-24: 2 mg via INTRAVENOUS
  Administered 2014-07-24: 6 mg via INTRAVENOUS

## 2014-07-24 MED ORDER — GLYCOPYRROLATE 0.2 MG/ML IJ SOLN
INTRAMUSCULAR | Status: AC
  Filled 2014-07-24: qty 3

## 2014-07-24 MED ORDER — ACETAMINOPHEN 325 MG PO TABS: 650.0000 mg | ORAL_TABLET | ORAL | Status: DC | PRN

## 2014-07-24 MED ORDER — CISATRACURIUM BESYLATE (PF) 10 MG/5ML IV SOLN: INTRAVENOUS | Status: DC | PRN

## 2014-07-24 MED ORDER — LACTATED RINGERS IV SOLN
INTRAVENOUS | Status: DC | PRN
  Administered 2014-07-24: 07:00:00 via INTRAVENOUS

## 2014-07-24 MED ORDER — BUPIVACAINE-EPINEPHRINE 0.25% -1:200000 IJ SOLN
INTRAMUSCULAR | Status: AC
  Filled 2014-07-24: qty 1

## 2014-07-24 MED ORDER — LABETALOL HCL 5 MG/ML IV SOLN
INTRAVENOUS | Status: DC | PRN
  Administered 2014-07-24: 10 mg via INTRAVENOUS

## 2014-07-24 MED ORDER — BUPIVACAINE-EPINEPHRINE 0.25% -1:200000 IJ SOLN
INTRAMUSCULAR | Status: DC | PRN
  Administered 2014-07-24: 8 mL

## 2014-07-24 MED ORDER — PROPOFOL 10 MG/ML IV BOLUS
INTRAVENOUS | Status: DC | PRN
  Administered 2014-07-24: 100 mg via INTRAVENOUS
  Administered 2014-07-24: 50 mg via INTRAVENOUS

## 2014-07-24 MED ORDER — FENTANYL CITRATE 0.05 MG/ML IJ SOLN
INTRAMUSCULAR | Status: AC
  Filled 2014-07-24: qty 5

## 2014-07-24 MED ORDER — HYDROMORPHONE HCL 1 MG/ML IJ SOLN: 0.2500 mg | INTRAMUSCULAR | Status: DC | PRN

## 2014-07-24 MED ORDER — ACETAMINOPHEN 650 MG RE SUPP: 650.0000 mg | RECTAL | Status: DC | PRN

## 2014-07-24 MED ORDER — LACTATED RINGERS IV SOLN
INTRAVENOUS | Status: DC
  Administered 2014-07-24: 10:00:00 via INTRAVENOUS

## 2014-07-24 MED ORDER — DEXAMETHASONE SODIUM PHOSPHATE 10 MG/ML IJ SOLN
INTRAMUSCULAR | Status: AC
  Filled 2014-07-24: qty 1

## 2014-07-24 MED ORDER — NEOSTIGMINE METHYLSULFATE 10 MG/10ML IV SOLN
INTRAVENOUS | Status: DC | PRN
  Administered 2014-07-24: 4 mg via INTRAVENOUS

## 2014-07-24 MED ORDER — ONDANSETRON HCL 4 MG/2ML IJ SOLN
INTRAMUSCULAR | Status: DC | PRN
  Administered 2014-07-24: 4 mg via INTRAVENOUS

## 2014-07-24 MED ORDER — DEXAMETHASONE SODIUM PHOSPHATE 10 MG/ML IJ SOLN
INTRAMUSCULAR | Status: DC | PRN
  Administered 2014-07-24: 10 mg via INTRAVENOUS

## 2014-07-24 MED ORDER — GLYCOPYRROLATE 0.2 MG/ML IJ SOLN
INTRAMUSCULAR | Status: DC | PRN
  Administered 2014-07-24: 0.6 mg via INTRAVENOUS

## 2014-07-24 MED ORDER — OXYCODONE HCL 5 MG PO TABS: 5.0000 mg | ORAL_TABLET | ORAL | Status: DC | PRN

## 2014-07-24 MED ORDER — LACTATED RINGERS IR SOLN
Status: DC | PRN
  Administered 2014-07-24: 1000 mL

## 2014-07-24 MED ORDER — CEFAZOLIN SODIUM-DEXTROSE 2-3 GM-% IV SOLR
2.0000 g | INTRAVENOUS | Status: AC
  Administered 2014-07-24: 2 g via INTRAVENOUS

## 2014-07-24 MED ORDER — LACTATED RINGERS IV SOLN: INTRAVENOUS | Status: DC

## 2014-07-24 MED ORDER — PROPOFOL 10 MG/ML IV BOLUS
INTRAVENOUS | Status: AC
  Filled 2014-07-24: qty 20

## 2014-07-24 MED ORDER — FENTANYL CITRATE 0.05 MG/ML IJ SOLN
INTRAMUSCULAR | Status: DC | PRN
  Administered 2014-07-24: 50 ug via INTRAVENOUS
  Administered 2014-07-24 (×2): 25 ug via INTRAVENOUS

## 2014-07-24 MED ORDER — CISATRACURIUM BESYLATE 20 MG/10ML IV SOLN
INTRAVENOUS | Status: AC
  Filled 2014-07-24: qty 10

## 2014-07-24 MED ORDER — 0.9 % SODIUM CHLORIDE (POUR BTL) OPTIME
TOPICAL | Status: DC | PRN
  Administered 2014-07-24: 1000 mL

## 2014-07-24 MED ORDER — EPHEDRINE SULFATE 50 MG/ML IJ SOLN
INTRAMUSCULAR | Status: AC
  Filled 2014-07-24: qty 1

## 2014-07-24 MED ORDER — SUCCINYLCHOLINE CHLORIDE 20 MG/ML IJ SOLN
INTRAMUSCULAR | Status: DC | PRN
  Administered 2014-07-24: 100 mg via INTRAVENOUS

## 2014-07-24 MED ORDER — SODIUM CHLORIDE 0.9 % IJ SOLN: 3.0000 mL | Freq: Two times a day (BID) | INTRAMUSCULAR | Status: DC

## 2014-07-24 MED ORDER — CEFAZOLIN SODIUM-DEXTROSE 2-3 GM-% IV SOLR
INTRAVENOUS | Status: AC
  Filled 2014-07-24: qty 50

## 2014-07-24 MED ORDER — SODIUM CHLORIDE 0.9 % IJ SOLN: 3.0000 mL | INTRAMUSCULAR | Status: DC | PRN

## 2014-07-24 MED ORDER — ONDANSETRON HCL 4 MG/2ML IJ SOLN
INTRAMUSCULAR | Status: AC
  Filled 2014-07-24: qty 2

## 2014-07-24 MED ORDER — EPHEDRINE SULFATE 50 MG/ML IJ SOLN
INTRAMUSCULAR | Status: DC | PRN
  Administered 2014-07-24: 10 mg via INTRAVENOUS

## 2014-07-24 MED ORDER — SODIUM CHLORIDE 0.9 % IV SOLN: 250.0000 mL | INTRAVENOUS | Status: DC | PRN

## 2014-07-24 MED ORDER — OXYCODONE-ACETAMINOPHEN 5-325 MG PO TABS: 1.0000 | ORAL_TABLET | ORAL | Status: DC | PRN

## 2014-07-24 MED ORDER — LABETALOL HCL 5 MG/ML IV SOLN
INTRAVENOUS | Status: AC
  Filled 2014-07-24: qty 4

## 2014-07-24 SURGICAL SUPPLY — 28 items
BAG URINE DRAINAGE (UROLOGICAL SUPPLIES) ×3 IMPLANT
BENZOIN TINCTURE PRP APPL 2/3 (GAUZE/BANDAGES/DRESSINGS) ×3 IMPLANT
CABLE HIGH FREQUENCY MONO STRZ (ELECTRODE) IMPLANT
CATH FOLEY 3WAY 30CC 16FR (CATHETERS) ×3 IMPLANT
CHLORAPREP W/TINT 26ML (MISCELLANEOUS) ×3 IMPLANT
CLOSURE WOUND 1/2 X4 (GAUZE/BANDAGES/DRESSINGS) ×1
DECANTER SPIKE VIAL GLASS SM (MISCELLANEOUS) ×3 IMPLANT
DRAPE LAPAROSCOPIC ABDOMINAL (DRAPES) ×3 IMPLANT
ELECT REM PT RETURN 9FT ADLT (ELECTROSURGICAL) ×3
ELECTRODE REM PT RTRN 9FT ADLT (ELECTROSURGICAL) ×1 IMPLANT
GLOVE BIO SURGEON STRL SZ7.5 (GLOVE) ×3 IMPLANT
GOWN STRL REUS W/TWL XL LVL3 (GOWN DISPOSABLE) ×6 IMPLANT
KIT BASIN OR (CUSTOM PROCEDURE TRAY) ×3 IMPLANT
MESH 3DMAX 4X6 LT LRG (Mesh General) ×3 IMPLANT
RELOAD STAPLE HERNIA 4.0 BLUE (INSTRUMENTS) ×3 IMPLANT
SCISSORS LAP 5X35 DISP (ENDOMECHANICALS) IMPLANT
SET IRRIG TUBING LAPAROSCOPIC (IRRIGATION / IRRIGATOR) IMPLANT
SET IRRIG Y TYPE TUR BLADDER L (SET/KITS/TRAYS/PACK) ×3 IMPLANT
SOLUTION ANTI FOG 6CC (MISCELLANEOUS) IMPLANT
STAPLER HERNIA 12 8.5 360D (INSTRUMENTS) ×3 IMPLANT
STRIP CLOSURE SKIN 1/2X4 (GAUZE/BANDAGES/DRESSINGS) ×2 IMPLANT
SUT MNCRL AB 4-0 PS2 18 (SUTURE) ×3 IMPLANT
TOWEL OR 17X26 10 PK STRL BLUE (TOWEL DISPOSABLE) ×3 IMPLANT
TOWEL OR NON WOVEN STRL DISP B (DISPOSABLE) ×3 IMPLANT
TRAY FOLEY CATH 14FRSI W/METER (CATHETERS) ×3 IMPLANT
TRAY LAPAROSCOPIC (CUSTOM PROCEDURE TRAY) ×3 IMPLANT
TROCAR CANNULA W/PORT DUAL 5MM (MISCELLANEOUS) ×3 IMPLANT
TROCAR XCEL 12X100 BLDLESS (ENDOMECHANICALS) ×3 IMPLANT

## 2014-07-24 NOTE — H&P (Signed)
vHistory of Present Illness Axel Filler(Ayansh Feutz MD; 04/24/2014 1:39 PM) Patient words: evaluate hernia.  [name] who presents with an inguinal hernia. [name] who is referred by Dr. Alwyn RenHopper for evaluation of a left inguinal hernia. Patient states that he had some pain to his left inguinal area while sitting down. He does state that yesterday prior to his visit he did have some pain to the left inguinal area. He states otherwise he has been asymptomatic up to this time. Patient states that he has had no signs or symptoms of incarceration or strangulation. Patient does have A. fib and is on Coumadin.   Other Problems [name], [name]; 04/24/2014 1:29 PM) Arthritis Atrial Fibrillation Back Pain Diverticulosis Enlarged Prostate Heart murmur  Past Surgical History [name], [name]; 04/24/2014 1:29 PM) Appendectomy Cataract Surgery Bilateral. Colon Polyp Removal - Colonoscopy Knee Surgery Left. Spinal Surgery - Lower Back Vasectomy  Diagnostic Studies History [name], [name]; 04/24/2014 1:29 PM) Colonoscopy 1-5 years ago  Medication History [name], [name]; 04/24/2014 1:29 PM) Medications Reconciled  Social History [name], [name]; 04/24/2014 1:29 PM) Alcohol use Occasional alcohol use. Caffeine use Carbonated beverages, Coffee. No drug use Tobacco use Never smoker.  Family History [name], [name]; 04/24/2014 1:29 PM) Alcohol Abuse Brother. Cancer Brother. Heart Disease Father. Heart disease in male family member before age 79 Respiratory Condition Brother, Mother.  Review of Systems [name]; 04/24/2014 1:29 PM) General Not Present- Appetite Loss, Chills, Fatigue, Fever, Night Sweats, Weight Gain and Weight Loss. Skin Present- Dryness. Not Present- Change in Wart/Mole, Hives, Jaundice, New Lesions, Non-Healing Wounds, Rash and Ulcer. HEENT Present- Seasonal Allergies, Sinus Pain and Wears  glasses/contact lenses. Not Present- Earache, Hearing Loss, Hoarseness, Nose Bleed, Oral Ulcers, Ringing in the Ears, Sore Throat, Visual Disturbances and Yellow Eyes. Respiratory Not Present- Bloody sputum, Chronic Cough, Difficulty Breathing, Snoring and Wheezing. Breast Not Present- Breast Mass, Breast Pain, Nipple Discharge and Skin Changes. Cardiovascular Present- Shortness of Breath. Not Present- Chest Pain, Difficulty Breathing Lying Down, Leg Cramps, Palpitations, Rapid Heart Rate and Swelling of Extremities. Gastrointestinal Present- Constipation. Not Present- Abdominal Pain, Bloating, Bloody Stool, Change in Bowel Habits, Chronic diarrhea, Difficulty Swallowing, Excessive gas, Gets full quickly at meals, Hemorrhoids, Indigestion, Nausea, Rectal Pain and Vomiting. Male Genitourinary Present- Impotence and Urine Leakage. Not Present- Blood in Urine, Change in Urinary Stream, Frequency, Nocturia, Painful Urination and Urgency. Musculoskeletal Present- Back Pain and Joint Stiffness. Not Present- Joint Pain, Muscle Pain, Muscle Weakness and Swelling of Extremities. Neurological Present- Decreased Memory. Not Present- Fainting, Headaches, Numbness, Seizures, Tingling, Tremor, Trouble walking and Weakness. Psychiatric Present- Anxiety. Not Present- Bipolar, Change in Sleep Pattern, Depression, Fearful and Frequent crying. Endocrine Not Present- Cold Intolerance, Excessive Hunger, Hair Changes, Heat Intolerance, Hot flashes and New Diabetes. Hematology Present- Easy Bruising and Excessive bleeding. Not Present- Gland problems, HIV and Persistent Infections.   Vitals [name] MA; 04/24/2014 1:29 PM) 04/24/2014 1:28 PM Weight: 199.8 lb Temp.: 97.72F(Temporal)  Pulse: 88 (Regular)  Resp.: 18 (Unlabored)  BP: 124/86 (Sitting, Left Arm, Standard)    Physical Exam Axel Filler(Marthella Osorno MD; 04/24/2014 1:40 PM) General Mental Status-Alert. General Appearance-Consistent with stated  age. Hydration-Well hydrated. Voice-Normal.  Head and Neck Head-normocephalic, atraumatic with no lesions or palpable masses. Trachea-midline.  Eye Eyeball - Bilateral-Extraocular movements intact. Sclera/Conjunctiva - Bilateral-No scleral icterus.  Chest and Lung Exam Chest and lung exam reveals -quiet, even and easy respiratory effort with no use of  accessory muscles and on auscultation, normal breath sounds, no adventitious sounds and normal vocal resonance. Inspection Chest Wall - Normal. Back - normal.  Cardiovascular Cardiovascular examination reveals -normal heart sounds, regular rate and rhythm with no murmurs and normal pedal pulses bilaterally.  Abdomen Inspection Skin - Scar - no surgical scars. Hernias - Inguinal hernia - Left - Reducible. Right - Reducible. Palpation/Percussion Palpation and Percussion of the abdomen reveal - Soft, Non Tender, No Rebound tenderness, No Rigidity (guarding) and No hepatosplenomegaly. Auscultation Auscultation of the abdomen reveals - Bowel sounds normal.  Neurologic Neurologic evaluation reveals -alert and oriented x 3 with no impairment of recent or remote memory. Mental Status-Normal.  Musculoskeletal Normal Exam - Left-Upper Extremity Strength Normal and Lower Extremity Strength Normal. Normal Exam - Right-Upper Extremity Strength Normal, Lower Extremity Weakness.    Assessment & Plan (Issac Moure MD; 04/24/2014 1:42 PM) LEFT INGUINAL HERNIA (550.90  K40.90) Impression: 87-year-old male with a left inguinal hernia, reducible.  Lap LIHR with mesh All risks and benefits were discussed with the patient, to generally include infection, bleeding, damage to surrounding structures, acute and chronic nerve pain, and recurrence. Alternatives were offered and described.  All questions were answered and the patient voiced understanding of the procedure and wishes to proceed at this point.  

## 2014-07-24 NOTE — Op Note (Signed)
07/24/2014  8:36 AM  PATIENT:  Colin Miller  81 y.o. male  PRE-OPERATIVE DIAGNOSIS:  LEFT INGUINAL HERNIA  POST-OPERATIVE DIAGNOSIS:  LEFT INDIRECT INGUINAL HERNIA  PROCEDURE:  Procedure(s): LAPAROSCOPIC LEFT INGUINAL HERNIA REPAIR WITH MESH (Left) INSERTION OF MESH (Left)  SURGEON:  Surgeon(s) and Role:    * Axel FillerArmando Stasia Somero, MD - Primary  ANESTHESIA:   local and general  EBL:<5CC   Total I/O In: -  Out: 200 [Urine:200]  BLOOD ADMINISTERED:none  DRAINS: none   LOCAL MEDICATIONS USED:  BUPIVICAINE   SPECIMEN:  No Specimen  DISPOSITION OF SPECIMEN:  N/A  COUNTS:  YES  TOURNIQUET:  * No tourniquets in log *  DICTATION: .Dragon Dictation  Counts: reported as correct x 2  Findings:  The patient had a small left indirect hernia  Indications for procedure:  [name] with a left inguinal hernia for several months. Patient complained of symptomatology to his left inguinal area. The patient was taken back for elective inguinal hernia repair.  Details of the procedure: The patient was taken back to the operating room. The patient was placed in supine position with bilateral SCDs in place.  The patient was prepped and draped in the usual sterile fashion.  After appropriate anitbiotics were confirmed, a time-out was confirmed and all facts were verified.  0.25% Marcaine was used to infiltrate the umbilical area. A 11-blade was used to cut down the skin and blunt dissection was used to get the anterior fashion.  The anterior fascia was incised approximately 1 cm and the muscles were retracted laterally. Blunt dissection was then used to create a space in the preperitoneal area. At this time a 10 mm camera was then introduced into the space and advanced the pubic tubercle and a 12 mm trocar was placed over this and insufflation was started.  At this time and space was created from medial to laterally the preperitoneal space.  Cooper's ligament was initially  cleaned off.  The hernia sac was identified in the left indirect space. Dissection of the hernia sac was undertaken the vas deferens was identified and protected in all parts of the case.  There was a small tear into the hernia sac. A Veress needle right upper quadrant to help evacuate the intraperitoneal air.    Once the hernia sac was taken down to approximately the umbilicus a Bard 3D Max mesh, size: Large, was  introduced into the preperitoneal space.  The mesh was brought over to cover the direct and indirect hernia spaces.  This was anchored into place and secured to Cooper's ligament with 4.620mm staples from a [place] hernia stapler. It was anchored to the anterior abdominal wall with 4.8 mm staples. The hernia sac was seen lying posterior to the mesh. There was no staples placed laterally. The insufflation was evacuated and the peritoneum was seen posterior to the mesh. The trochars were removed. The anterior fascia was reapproximated using #1 Vicryl on a UR- 6.  Intra-abdominal air was evacuated and the Veress needle removed. The skin was reapproximated using 4-0 Monocryl subcuticular fashion the patient was awakened from general anesthesia and taken to recovery in stable condition.   PLAN OF CARE: Discharge to home after PACU  PATIENT DISPOSITION:  PACU - hemodynamically stable.   Delay start of Pharmacological VTE agent (>24hrs) due to surgical blood loss or risk of bleeding: not applicable

## 2014-07-24 NOTE — Transfer of Care (Signed)
Immediate Anesthesia Transfer of Care Note  Patient: Colin Miller  Procedure(s) Performed: Procedure(s): LAPAROSCOPIC LEFT INGUINAL HERNIA REPAIR WITH MESH (Left) INSERTION OF MESH (Left)  Patient Location: PACU  Anesthesia Type:General  Level of Consciousness: awake, alert , oriented and patient cooperative  Airway & Oxygen Therapy: Patient Spontanous Breathing and Patient connected to face mask oxygen  Post-op Assessment: Report given to RN and Post -op Vital signs reviewed and stable  Post vital signs: Reviewed and stable  Last Vitals:  Filed Vitals:   07/24/14 0524  BP: 127/75  Pulse: 76  Temp: 36.6 C  Resp: 16    Complications: No apparent anesthesia complications

## 2014-07-24 NOTE — Discharge Instructions (Signed)
[facility] _______[facility] ° °INGUINAL HERNIA REPAIR: POST OP INSTRUCTIONS ° °Always review your discharge instruction sheet given to you by the facility where your surgery was performed. °IF YOU HAVE DISABILITY OR FAMILY LEAVE FORMS, YOU MUST BRING THEM TO THE OFFICE FOR PROCESSING.   °DO NOT GIVE THEM TO YOUR DOCTOR. ° °1. A  prescription for pain medication may be given to you upon discharge.  Take your pain medication as prescribed, if needed.  If narcotic pain medicine is not needed, then you may take acetaminophen (Tylenol) or ibuprofen (Advil) as needed. °2. Take your usually prescribed medications unless otherwise directed. °3. If you need a refill on your pain medication, please contact your pharmacy.  They will contact our office to request authorization. Prescriptions will not be filled after 5 pm or on week-ends. °4. You should follow a light diet the first 24 hours after arrival home, such as soup and crackers, etc.  Be sure to include lots of fluids daily.  Resume your normal diet the day after surgery. °5. Most patients will experience some swelling and bruising around the umbilicus or in the groin and scrotum.  Ice packs and reclining will help.  Swelling and bruising can take several days to resolve.  °6. It is common to experience some constipation if taking pain medication after surgery.  Increasing fluid intake and taking a stool softener (such as Colace) will usually help or prevent this problem from occurring.  A mild laxative (Milk of Magnesia or Miralax) should be taken according to package directions if there are no bowel movements after 48 hours. °7. Unless discharge instructions indicate otherwise, you may remove your bandages 24-48 hours after surgery, and you may shower at that time.  You may have steri-strips (small skin tapes) in place directly over the incision.  These strips should be left on the skin for 7-10 days.  If your surgeon used skin glue on the incision, you  may shower in 24 hours.  The glue will flake off over the next 2-3 weeks.  Any sutures or staples will be removed at the office during your follow-up visit. °8. ACTIVITIES:  You may resume regular (light) daily activities beginning the next day--such as daily self-care, walking, climbing stairs--gradually increasing activities as tolerated.  You may have sexual intercourse when it is comfortable.  Refrain from any heavy lifting or straining until approved by your doctor. °a. You may drive when you are no longer taking prescription pain medication, you can comfortably wear a seatbelt, and you can safely maneuver your car and apply brakes. °b. RETURN TO WORK:  __________________________________________________________ °9. You should see your doctor in the office for a follow-up appointment approximately 2-3 weeks after your surgery.  Make sure that you call for this appointment within a day or two after you arrive home to insure a convenient appointment time. °10. OTHER INSTRUCTIONS:  __________________________________________________________________________________________________________________________________________________________________________________________  °WHEN TO CALL YOUR DOCTOR: °1. Fever over 101.0 °2. Inability to urinate °3. Nausea and/or vomiting °4. Extreme swelling or bruising °5. Continued bleeding from incision. °6. Increased pain, redness, or drainage from the incision ° °The clinic staff is available to answer your questions during regular business hours.  Please don’t hesitate to call and ask to speak to one of the nurses for clinical concerns.  If you have a medical emergency, go to the nearest emergency room or call 911.  A surgeon from [facility] is always on call at the hospital ° ° °[address], [address], [place], [place]  [address] ? ° [address] [address], [place], [place]   [address] °[phone] ? [phone] ? FAX [phone] °Web site:  [email or web address] ° °

## 2014-07-24 NOTE — Progress Notes (Signed)
Do not catheterize per Dr. Derrell Lollingamirez. Keep patient until he voids

## 2014-07-24 NOTE — Anesthesia Procedure Notes (Signed)
Procedure Name: Intubation Date/Time: 07/24/2014 7:32 AM Performed by: Delphia GratesHANDLER, Reizel Calzada Pre-anesthesia Checklist: Patient identified, Emergency Drugs available, Suction available and Patient being monitored Patient Re-evaluated:Patient Re-evaluated prior to inductionOxygen Delivery Method: Circle system utilized Preoxygenation: Pre-oxygenation with 100% oxygen Intubation Type: IV induction Laryngoscope Size: Mac and 4 Grade View: Grade II Tube type: Oral Tube size: 7.5 mm Number of attempts: 1 Airway Equipment and Method: Stylet Placement Confirmation: ETT inserted through vocal cords under direct vision,  breath sounds checked- equal and bilateral and positive ETCO2 Secured at: 22 cm Tube secured with: Tape Dental Injury: Teeth and Oropharynx as per pre-operative assessment  Comments: Intubated by EMT student.

## 2014-07-24 NOTE — Anesthesia Postprocedure Evaluation (Signed)
  Anesthesia Post-op Note  Patient: Colin Miller  Procedure(s) Performed: Procedure(s) (LRB): LAPAROSCOPIC LEFT INGUINAL HERNIA REPAIR WITH MESH (Left) INSERTION OF MESH (Left)  Patient Location: PACU  Anesthesia Type: General  Level of Consciousness: awake and alert   Airway and Oxygen Therapy: Patient Spontanous Breathing  Post-op Pain: mild  Post-op Assessment: Post-op Vital signs reviewed, Patient's Cardiovascular Status Stable, Respiratory Function Stable, Patent Airway and No signs of Nausea or vomiting  Last Vitals:  Filed Vitals:   07/24/14 0925  BP: 135/96  Pulse: 76  Temp: 36.4 C  Resp: 16    Post-op Vital Signs: stable   Complications: No apparent anesthesia complications

## 2014-07-25 ENCOUNTER — Encounter (HOSPITAL_COMMUNITY): Payer: Self-pay | Admitting: General Surgery

## 2014-07-31 ENCOUNTER — Ambulatory Visit (INDEPENDENT_AMBULATORY_CARE_PROVIDER_SITE_OTHER): Payer: Medicare Other | Admitting: General Practice

## 2014-07-31 ENCOUNTER — Ambulatory Visit: Payer: Medicare Other

## 2014-07-31 DIAGNOSIS — I4891 Unspecified atrial fibrillation: Secondary | ICD-10-CM | POA: Diagnosis not present

## 2014-07-31 LAB — POCT INR: INR: 1.3

## 2014-07-31 NOTE — Progress Notes (Signed)
Agree with plan 

## 2014-07-31 NOTE — Progress Notes (Signed)
Pre visit review using our clinic review tool, if applicable. No additional management support is needed unless otherwise documented below in the visit note. 

## 2014-08-07 DIAGNOSIS — N401 Enlarged prostate with lower urinary tract symptoms: Secondary | ICD-10-CM | POA: Diagnosis not present

## 2014-08-07 DIAGNOSIS — E291 Testicular hypofunction: Secondary | ICD-10-CM | POA: Diagnosis not present

## 2014-08-07 DIAGNOSIS — R350 Frequency of micturition: Secondary | ICD-10-CM | POA: Diagnosis not present

## 2014-08-07 DIAGNOSIS — N500 Atrophy of testis: Secondary | ICD-10-CM | POA: Diagnosis not present

## 2014-08-09 DIAGNOSIS — H04123 Dry eye syndrome of bilateral lacrimal glands: Secondary | ICD-10-CM | POA: Diagnosis not present

## 2014-08-09 DIAGNOSIS — H35373 Puckering of macula, bilateral: Secondary | ICD-10-CM | POA: Diagnosis not present

## 2014-08-15 ENCOUNTER — Other Ambulatory Visit: Payer: Self-pay | Admitting: General Practice

## 2014-08-15 ENCOUNTER — Ambulatory Visit (INDEPENDENT_AMBULATORY_CARE_PROVIDER_SITE_OTHER): Payer: Medicare Other | Admitting: General Practice

## 2014-08-15 DIAGNOSIS — Z5181 Encounter for therapeutic drug level monitoring: Secondary | ICD-10-CM | POA: Diagnosis not present

## 2014-08-15 LAB — POCT INR: INR: 1.4

## 2014-08-15 MED ORDER — WARFARIN SODIUM 5 MG PO TABS: ORAL_TABLET | ORAL | Status: DC

## 2014-08-15 NOTE — Progress Notes (Signed)
Agree with plan 

## 2014-08-15 NOTE — Progress Notes (Signed)
Pre visit review using our clinic review tool, if applicable. No additional management support is needed unless otherwise documented below in the visit note. 

## 2014-08-16 ENCOUNTER — Other Ambulatory Visit: Payer: Self-pay | Admitting: Family

## 2014-08-17 DIAGNOSIS — N401 Enlarged prostate with lower urinary tract symptoms: Secondary | ICD-10-CM | POA: Diagnosis not present

## 2014-08-29 ENCOUNTER — Other Ambulatory Visit: Payer: Self-pay | Admitting: General Practice

## 2014-08-29 ENCOUNTER — Ambulatory Visit (INDEPENDENT_AMBULATORY_CARE_PROVIDER_SITE_OTHER): Payer: Medicare Other | Admitting: General Practice

## 2014-08-29 DIAGNOSIS — I4891 Unspecified atrial fibrillation: Secondary | ICD-10-CM

## 2014-08-29 DIAGNOSIS — Z5181 Encounter for therapeutic drug level monitoring: Secondary | ICD-10-CM | POA: Diagnosis not present

## 2014-08-29 LAB — POCT INR: INR: 4.3

## 2014-08-29 NOTE — Progress Notes (Signed)
Agree with plan 

## 2014-08-29 NOTE — Progress Notes (Signed)
Pre visit review using our clinic review tool, if applicable. No additional management support is needed unless otherwise documented below in the visit note. 

## 2014-08-31 DIAGNOSIS — R350 Frequency of micturition: Secondary | ICD-10-CM | POA: Diagnosis not present

## 2014-09-24 DIAGNOSIS — Z7901 Long term (current) use of anticoagulants: Secondary | ICD-10-CM | POA: Diagnosis not present

## 2014-09-24 DIAGNOSIS — Z5181 Encounter for therapeutic drug level monitoring: Secondary | ICD-10-CM | POA: Diagnosis not present

## 2014-09-24 LAB — PROTIME-INR: INR: 2.3 — AB (ref 0.9–1.1)

## 2014-09-26 ENCOUNTER — Ambulatory Visit: Payer: Medicare Other

## 2014-09-26 ENCOUNTER — Ambulatory Visit (INDEPENDENT_AMBULATORY_CARE_PROVIDER_SITE_OTHER): Payer: Medicare Other | Admitting: General Practice

## 2014-09-26 DIAGNOSIS — I4891 Unspecified atrial fibrillation: Secondary | ICD-10-CM

## 2014-09-26 NOTE — Progress Notes (Signed)
I have reviewed and agree with the plan. 

## 2014-09-26 NOTE — Progress Notes (Signed)
Pre visit review using our clinic review tool, if applicable. No additional management support is needed unless otherwise documented below in the visit note. 

## 2014-10-25 ENCOUNTER — Telehealth: Payer: Self-pay | Admitting: General Practice

## 2014-10-25 ENCOUNTER — Other Ambulatory Visit: Payer: Self-pay | Admitting: Urology

## 2014-10-25 NOTE — Telephone Encounter (Signed)
-----   Message from Judie BonusElizabeth A Kollar, MD sent at 10/25/2014  9:14 AM EDT ----- Thanks for asking, I would not recommend bridging for this patient.  Dr. Dorise HissKollar ----- Message -----    From: Garrison Columbusynthia D Boyd, RN    Sent: 10/25/2014   9:11 AM      To: Judie BonusElizabeth A Kollar, MD  Hi Dr Dorise HissKollar  Patient needs to stop coumadin for 5 days for urinary procedure on 8/12.  Do you want Lovenox bridge for patient?  Thanks, Weyerhaeuser CompanyCindy

## 2014-10-25 NOTE — Telephone Encounter (Signed)
Faxed clearance to [facility] for patient to stop coumadin 5 days prior to procedure on 11/23/14.  Lovenox bridge not needed per Dr. Genella MechElizabeth Kollar.

## 2014-10-30 ENCOUNTER — Ambulatory Visit (INDEPENDENT_AMBULATORY_CARE_PROVIDER_SITE_OTHER): Payer: Medicare Other | Admitting: General Practice

## 2014-10-30 DIAGNOSIS — I4891 Unspecified atrial fibrillation: Secondary | ICD-10-CM

## 2014-10-30 LAB — POCT INR: INR: 2.1

## 2014-10-30 NOTE — Progress Notes (Signed)
I have reviewed and agree with the plan. 

## 2014-10-30 NOTE — Progress Notes (Signed)
Pre visit review using our clinic review tool, if applicable. No additional management support is needed unless otherwise documented below in the visit note. 

## 2014-11-12 DIAGNOSIS — J0100 Acute maxillary sinusitis, unspecified: Secondary | ICD-10-CM | POA: Diagnosis not present

## 2014-11-23 ENCOUNTER — Ambulatory Visit: Admit: 2014-11-23 | Payer: Medicare Other | Admitting: Urology

## 2014-11-23 SURGERY — CYSTOSCOPY WITH INSERTION OF UROLIFT
Anesthesia: General

## 2014-12-04 ENCOUNTER — Ambulatory Visit: Payer: Medicare Other

## 2014-12-04 DIAGNOSIS — Z0000 Encounter for general adult medical examination without abnormal findings: Secondary | ICD-10-CM | POA: Diagnosis not present

## 2014-12-04 LAB — POCT INR: INR: 2.97

## 2014-12-06 ENCOUNTER — Ambulatory Visit (INDEPENDENT_AMBULATORY_CARE_PROVIDER_SITE_OTHER): Payer: Medicare Other | Admitting: General Practice

## 2014-12-06 DIAGNOSIS — I4891 Unspecified atrial fibrillation: Secondary | ICD-10-CM

## 2014-12-06 NOTE — Progress Notes (Signed)
Pre visit review using our clinic review tool, if applicable. No additional management support is needed unless otherwise documented below in the visit note. 

## 2015-01-02 ENCOUNTER — Ambulatory Visit (INDEPENDENT_AMBULATORY_CARE_PROVIDER_SITE_OTHER): Payer: Medicare Other | Admitting: General Practice

## 2015-01-02 DIAGNOSIS — Z5181 Encounter for therapeutic drug level monitoring: Secondary | ICD-10-CM

## 2015-01-02 LAB — POCT INR: INR: 2.4

## 2015-01-02 NOTE — Progress Notes (Signed)
I have reviewed and agree with the plan. 

## 2015-01-02 NOTE — Progress Notes (Signed)
Pre visit review using our clinic review tool, if applicable. No additional management support is needed unless otherwise documented below in the visit note. 

## 2015-01-13 DIAGNOSIS — Z23 Encounter for immunization: Secondary | ICD-10-CM | POA: Diagnosis not present

## 2015-01-30 ENCOUNTER — Ambulatory Visit (INDEPENDENT_AMBULATORY_CARE_PROVIDER_SITE_OTHER): Payer: Medicare Other | Admitting: General Practice

## 2015-01-30 DIAGNOSIS — I4891 Unspecified atrial fibrillation: Secondary | ICD-10-CM

## 2015-01-30 DIAGNOSIS — Z5181 Encounter for therapeutic drug level monitoring: Secondary | ICD-10-CM | POA: Diagnosis not present

## 2015-01-30 LAB — POCT INR: INR: 2.5

## 2015-01-30 NOTE — Progress Notes (Signed)
Pre visit review using our clinic review tool, if applicable. No additional management support is needed unless otherwise documented below in the visit note. 

## 2015-01-30 NOTE — Progress Notes (Signed)
I have reviewed and agree with the plan. 

## 2015-02-06 ENCOUNTER — Ambulatory Visit: Payer: Medicare Other

## 2015-03-13 ENCOUNTER — Encounter: Payer: Self-pay | Admitting: Internal Medicine

## 2015-03-13 ENCOUNTER — Other Ambulatory Visit (INDEPENDENT_AMBULATORY_CARE_PROVIDER_SITE_OTHER): Payer: Medicare Other

## 2015-03-13 ENCOUNTER — Ambulatory Visit (INDEPENDENT_AMBULATORY_CARE_PROVIDER_SITE_OTHER): Payer: Medicare Other | Admitting: General Practice

## 2015-03-13 ENCOUNTER — Ambulatory Visit (INDEPENDENT_AMBULATORY_CARE_PROVIDER_SITE_OTHER): Payer: Medicare Other | Admitting: Internal Medicine

## 2015-03-13 VITALS — BP 152/100 | HR 59 | Temp 98.3°F | Resp 16 | Ht 71.0 in | Wt 210.8 lb

## 2015-03-13 DIAGNOSIS — R0600 Dyspnea, unspecified: Secondary | ICD-10-CM

## 2015-03-13 DIAGNOSIS — Z5181 Encounter for therapeutic drug level monitoring: Secondary | ICD-10-CM | POA: Diagnosis not present

## 2015-03-13 LAB — CBC
HEMATOCRIT: 41.1 % (ref 39.0–52.0)
HEMOGLOBIN: 13.4 g/dL (ref 13.0–17.0)
MCHC: 32.6 g/dL (ref 30.0–36.0)
MCV: 91.4 fl (ref 78.0–100.0)
PLATELETS: 184 10*3/uL (ref 150.0–400.0)
RBC: 4.5 Mil/uL (ref 4.22–5.81)
RDW: 15.7 % — ABNORMAL HIGH (ref 11.5–15.5)
WBC: 8.4 10*3/uL (ref 4.0–10.5)

## 2015-03-13 LAB — COMPREHENSIVE METABOLIC PANEL
ALT: 14 U/L (ref 0–53)
AST: 19 U/L (ref 0–37)
Albumin: 4.2 g/dL (ref 3.5–5.2)
Alkaline Phosphatase: 89 U/L (ref 39–117)
BUN: 11 mg/dL (ref 6–23)
CO2: 29 meq/L (ref 19–32)
Calcium: 9.6 mg/dL (ref 8.4–10.5)
Chloride: 93 mEq/L — ABNORMAL LOW (ref 96–112)
Creatinine, Ser: 0.93 mg/dL (ref 0.40–1.50)
GFR: 81.88 mL/min (ref >60.00)
GLUCOSE: 105 mg/dL — AB (ref 70–99)
POTASSIUM: 4.4 meq/L (ref 3.5–5.1)
SODIUM: 129 meq/L — AB (ref 135–145)
Total Bilirubin: 1.6 mg/dL — ABNORMAL HIGH (ref 0.2–1.2)
Total Protein: 7.3 g/dL (ref 6.0–8.3)

## 2015-03-13 LAB — POCT INR: INR: 2.1

## 2015-03-13 LAB — TROPONIN I: TNIDX: 0.02 ug/l (ref 0.00–0.06)

## 2015-03-13 LAB — BRAIN NATRIURETIC PEPTIDE: PRO B NATRI PEPTIDE: 482 pg/mL — AB (ref 0.0–100.0)

## 2015-03-13 MED ORDER — FLUTICASONE PROPIONATE 50 MCG/ACT NA SUSP: 2.0000 | Freq: Every day | NASAL | Status: DC

## 2015-03-13 MED ORDER — METHOCARBAMOL 500 MG PO TABS: 500.0000 mg | ORAL_TABLET | Freq: Three times a day (TID) | ORAL | Status: AC | PRN

## 2015-03-13 NOTE — Progress Notes (Signed)
I have reviewed and agree with the plan. 

## 2015-03-13 NOTE — Progress Notes (Signed)
Pre visit review using our clinic review tool, if applicable. No additional management support is needed unless otherwise documented below in the visit note. 

## 2015-03-13 NOTE — Patient Instructions (Signed)
We will check some blood work today and call you back with the results.   We have sent in the prescription for the muscle relaxer methocarbamol. You can take 1 pill up to 3 times a day for pain, use it as needed.   We have also sent in the flonase (fluticasone) which is the nose spray for the hoarseness. Use 2 sprays in each nostril once a day for 1-2 months to get rid of the hoarseness.   I would not recommend taking the naproxen on a regular basis. If you need it rarely it is okay but taking it regularly puts you at higher risk of bleeding in your stomach and intestines.

## 2015-03-14 ENCOUNTER — Other Ambulatory Visit: Payer: Self-pay | Admitting: Internal Medicine

## 2015-03-14 DIAGNOSIS — R0600 Dyspnea, unspecified: Secondary | ICD-10-CM

## 2015-03-14 MED ORDER — FUROSEMIDE 20 MG PO TABS: 20.0000 mg | ORAL_TABLET | Freq: Every day | ORAL | Status: DC

## 2015-03-14 NOTE — Assessment & Plan Note (Signed)
Checking BNP and refer to cardiology for evaluation. Concern for ischemia versus heart failure although symptoms not classic (no night time symptoms). Not volume overloaded on exam today. Has chronic A fib.

## 2015-03-14 NOTE — Progress Notes (Signed)
   Subjective:    Patient ID: Colin Miller, male    DOB: 01/31/1927, 81 y.o.   MRN: [id number]  HPI The patient is an 81 YO man coming in for dypsnea. He denies cold or flu symptoms. No cough or nasal congestion. Comes with exertion and is winded very quickly. Denies night time symptoms and no symptoms at rest. Non-smoker and never has been. No significant second hand smoke exposure. Has A fib and has not been to the cardiologist in some time. Is taking beta blocker and no change in dosage recently. No sweating with activity (out of ordinary) and no chest pains. No arm pain or numbness.   Review of Systems  Constitutional: Negative for fever, activity change, appetite change, fatigue and unexpected weight change.  HENT: Negative.   Eyes: Negative.   Respiratory: Positive for shortness of breath. Negative for cough, chest tightness and wheezing.   Cardiovascular: Negative for chest pain, palpitations and leg swelling.  Gastrointestinal: Negative for nausea, abdominal pain, diarrhea, constipation and abdominal distention.  Musculoskeletal: Positive for arthralgias. Negative for back pain and gait problem.  Skin: Negative.   Neurological: Negative.   Psychiatric/Behavioral: Negative.       Objective:   Physical Exam  Constitutional: He is oriented to person, place, and time. He appears well-developed and well-nourished.  HENT:  Head: Normocephalic and atraumatic.  Eyes: EOM are normal.  Neck: Normal range of motion.  Cardiovascular: Normal rate.   Irregularly irregular  Pulmonary/Chest: Effort normal and breath sounds normal. No respiratory distress. He has no wheezes. He has no rales.  Abdominal: Soft. Bowel sounds are normal. He exhibits no distension. There is no tenderness. There is no rebound.  Musculoskeletal: He exhibits no edema.  Neurological: He is alert and oriented to person, place, and time. Coordination normal.  Skin: Skin is warm and dry.  Psychiatric: He has a normal  mood and affect.   Filed Vitals:   03/13/15 1104  BP: 152/100  Pulse: 59  Temp: 98.3 F (36.8 C)  TempSrc: Oral  Resp: 16  Height: 5\' 11"  (1.803 m)  Weight: 210 lb 12.8 oz (95.618 kg)  SpO2: 98%      Assessment & Plan:

## 2015-03-18 ENCOUNTER — Ambulatory Visit (INDEPENDENT_AMBULATORY_CARE_PROVIDER_SITE_OTHER): Payer: Medicare Other | Admitting: Cardiology

## 2015-03-18 ENCOUNTER — Encounter: Payer: Self-pay | Admitting: Cardiology

## 2015-03-18 ENCOUNTER — Other Ambulatory Visit: Payer: Self-pay | Admitting: Internal Medicine

## 2015-03-18 VITALS — BP 118/82 | HR 67 | Ht 71.0 in | Wt 203.0 lb

## 2015-03-18 DIAGNOSIS — I4819 Other persistent atrial fibrillation: Secondary | ICD-10-CM

## 2015-03-18 DIAGNOSIS — I481 Persistent atrial fibrillation: Secondary | ICD-10-CM

## 2015-03-18 DIAGNOSIS — I10 Essential (primary) hypertension: Secondary | ICD-10-CM

## 2015-03-18 DIAGNOSIS — Z0000 Encounter for general adult medical examination without abnormal findings: Secondary | ICD-10-CM

## 2015-03-18 DIAGNOSIS — Z7901 Long term (current) use of anticoagulants: Secondary | ICD-10-CM | POA: Diagnosis not present

## 2015-03-18 NOTE — Progress Notes (Signed)
Patient ID: Colin Miller, male   DOB: 01/19/1927, 81 y.o.   MRN: [id number]      Cardiology Office Note  Date:  03/18/2015   ID:  Colin Miller, DOB 11/25/1926, MRN [id number]  PCP:  Myrlene BrokerElizabeth A Crawford, Colin Miller  Cardiologist:  Colin Miller, Colin Lamping Miller, Colin Miller   Chief complain: Establish care   History of Present Illness: Colin Miller is a 81 y.o. male who presents to establish cardiology care. [place] He has Miller/o hypertension and chronic persistent atrial fibrillation. He was diagnosed about 6 years ago when he underwent a stress test that was normal and an unsuccessful cardioversion was attempted. He has been in atrial fibrillation since then. On chronic coumadin therapy. No bleeding complications. Follows with our coumadin clinic. He is not very active, but independent, his major limitation is back pain. He likes to play golf but back pain has prevented it lately.  He denies palpitations, chest pain, DOE, claudications, orthopnea or PND. He is compliant and on the top of all of his meds. Denies dizziness other than when he stands up too fast.  Past Medical History  Diagnosis Date  . Arthritis   . Chicken pox   . Diverticulitis   . Glaucoma   . Allergy   . Heart murmur   . Colon polyps   . Neuropathy (HCC)     left leg    Past Surgical History  Procedure Laterality Date  . Appendectomy    . Back disc    . Inguinal hernia repair Left 07/24/2014    Procedure: LAPAROSCOPIC LEFT INGUINAL HERNIA REPAIR WITH MESH;  Surgeon: Axel FillerArmando Ramirez, Colin Miller;  Location: [facility];  Service: General;  Laterality: Left;  . Insertion of mesh Left 07/24/2014    Procedure: INSERTION OF MESH;  Surgeon: Axel FillerArmando Ramirez, Colin Miller;  Location: [facility];  Service: General;  Laterality: Left;   Current Outpatient Prescriptions  Medication Sig Dispense Refill  . atenolol (TENORMIN) 25 MG tablet Take 25 mg by mouth daily.    Marland Kitchen. docusate sodium (COLACE) 100 MG capsule Take 100 mg by mouth at bedtime.    .  fexofenadine (ALLEGRA) 180 MG tablet Take 180 mg by mouth daily as needed for allergies or rhinitis.    . finasteride (PROSCAR) 5 MG tablet Take 5 mg by mouth at bedtime.     . fluticasone (FLONASE) 50 MCG/ACT nasal spray Place 2 sprays into both nostrils daily. 16 g 6  . furosemide (LASIX) 20 MG tablet Take 1 tablet (20 mg total) by mouth daily. 30 tablet 1  . methocarbamol (ROBAXIN) 500 MG tablet Take 1 tablet (500 mg total) by mouth every 8 (eight) hours as needed for muscle spasms. 60 tablet 3  . Multiple Vitamins-Minerals (ICAPS) CAPS Take 2 capsules by mouth 2 (two) times daily.     Marland Kitchen. NAPROXEN PO Take 1 tablet by mouth as needed (for pain).    . tamsulosin (FLOMAX) 0.4 MG CAPS capsule Take 0.8 mg by mouth daily.    Marland Kitchen. warfarin (COUMADIN) 5 MG tablet Take as directed by anticoagulation 105 tablet 1   No current facility-administered medications for this visit.   Allergies:   Erythromycin   Social History:  The patient  reports that he has never smoked. He does not have any smokeless tobacco history on file. He reports that he drinks alcohol. He reports that he does not use illicit drugs.   Family History:  The patient's family history includes Cancer in his  brother; Heart disease in his father; Stroke in his other. There is no history of Diabetes.   ROS:  Please see the history of present illness.   Otherwise, review of systems are positive for none.   All other systems are reviewed and negative.   PHYSICAL EXAM: VS:  Ht  (1.803 m)  Wt 203 lb (92.08 kg)  BMI 28.33 kg/m2 , BMI Body mass index is 28.33 kg/(m^2). GEN: Well nourished, well developed, in no acute distress HEENT: normal Neck: no JVD, carotid bruits, or masses Cardiac: iRRR; no murmurs, rubs, or gallops,no edema  Respiratory:  clear to auscultation bilaterally, normal work of breathing GI: soft, nontender, nondistended, + BS MS: no deformity or atrophy Skin: warm and dry, no rash Neuro:  Strength and sensation are  intact Psych: euthymic mood, full affect  EKG:  EKG is ordered today. The ekg ordered today demonstrates atrial fibrillation with controlled ventricular response.   Recent Labs: 03/13/2015: ALT 14; BUN 11; Creatinine, Ser 0.93; Hemoglobin 13.4; Platelets 184.0; Potassium 4.4; Pro B Natriuretic peptide (BNP) 482.0*; Sodium 129*   Lipid Panel    Component Value Date/Time   CHOL 150 06/12/2014 1344   TRIG 49.0 06/12/2014 1344   HDL 52.40 06/12/2014 1344   CHOLHDL 3 06/12/2014 1344   VLDL 9.8 06/12/2014 1344   LDLCALC 88 06/12/2014 1344    Wt Readings from Last 3 Encounters:  03/18/15 203 lb (92.08 kg)  03/13/15 210 lb 12.8 oz (95.618 kg)  07/24/14 212 lb 9 oz (96.418 kg)    ASSESSMENT AND PLAN:  1.  Chronic persistent atrial fibrillation - rate controlled, asymptomatic, continue atenolol.  We will order a baseline echocardiogram since we don't have any.   2. Chronic anticoagulation on coumadin, no bleeding complications, compliant.   3. Hypertension - well controlled.  4. Lipids - at goal on no therapy. Followed by PCP.   Follow up in 1 year.  Signed, Colin Masson, Colin Miller  03/18/2015 8:30 AM    [facility] [address] [place], [place], [place]  [address] Phone: [phone]; Fax: [phone]

## 2015-03-18 NOTE — Patient Instructions (Addendum)
Your physician recommends that you continue on your current medications as directed. Please refer to the Current Medication list given to you today.   Your physician has requested that you have an echocardiogram. Echocardiography is a painless test that uses sound waves to create images of your heart. It provides your doctor with information about the size and shape of your heart and how well your heart's chambers and valves are working. This procedure takes approximately one hour. There are no restrictions for this procedure.    Your physician wants you to follow-up in: ONE YEAR WITH DR NELSON You will receive a reminder letter in the mail two months in advance. If you don't receive a letter, please call our office to schedule the follow-up appointment.   

## 2015-03-27 ENCOUNTER — Ambulatory Visit (HOSPITAL_COMMUNITY): Payer: Medicare Other | Attending: Cardiology

## 2015-03-27 ENCOUNTER — Other Ambulatory Visit: Payer: Self-pay

## 2015-03-27 ENCOUNTER — Telehealth: Payer: Self-pay | Admitting: *Deleted

## 2015-03-27 ENCOUNTER — Telehealth: Payer: Self-pay | Admitting: Cardiology

## 2015-03-27 DIAGNOSIS — I351 Nonrheumatic aortic (valve) insufficiency: Secondary | ICD-10-CM | POA: Diagnosis not present

## 2015-03-27 DIAGNOSIS — I10 Essential (primary) hypertension: Secondary | ICD-10-CM | POA: Diagnosis not present

## 2015-03-27 DIAGNOSIS — I358 Other nonrheumatic aortic valve disorders: Secondary | ICD-10-CM | POA: Insufficient documentation

## 2015-03-27 DIAGNOSIS — I340 Nonrheumatic mitral (valve) insufficiency: Secondary | ICD-10-CM | POA: Diagnosis not present

## 2015-03-27 DIAGNOSIS — M1990 Unspecified osteoarthritis, unspecified site: Secondary | ICD-10-CM | POA: Insufficient documentation

## 2015-03-27 DIAGNOSIS — I77810 Thoracic aortic ectasia: Secondary | ICD-10-CM | POA: Insufficient documentation

## 2015-03-27 DIAGNOSIS — I481 Persistent atrial fibrillation: Secondary | ICD-10-CM | POA: Insufficient documentation

## 2015-03-27 DIAGNOSIS — I517 Cardiomegaly: Secondary | ICD-10-CM | POA: Insufficient documentation

## 2015-03-27 DIAGNOSIS — I059 Rheumatic mitral valve disease, unspecified: Secondary | ICD-10-CM | POA: Diagnosis not present

## 2015-03-27 DIAGNOSIS — I071 Rheumatic tricuspid insufficiency: Secondary | ICD-10-CM | POA: Insufficient documentation

## 2015-03-27 DIAGNOSIS — I4819 Other persistent atrial fibrillation: Secondary | ICD-10-CM

## 2015-03-27 DIAGNOSIS — N400 Enlarged prostate without lower urinary tract symptoms: Secondary | ICD-10-CM | POA: Diagnosis not present

## 2015-03-27 DIAGNOSIS — I4891 Unspecified atrial fibrillation: Secondary | ICD-10-CM

## 2015-03-27 DIAGNOSIS — R931 Abnormal findings on diagnostic imaging of heart and coronary circulation: Secondary | ICD-10-CM | POA: Insufficient documentation

## 2015-03-27 MED ORDER — LISINOPRIL 2.5 MG PO TABS: 2.5000 mg | ORAL_TABLET | Freq: Every day | ORAL | Status: AC

## 2015-03-27 MED ORDER — LISINOPRIL 2.5 MG PO TABS: 2.5000 mg | ORAL_TABLET | Freq: Every day | ORAL | Status: DC

## 2015-03-27 NOTE — Telephone Encounter (Signed)
New message      Pt forgot to tell the nurse earlier that he will be out of town for 3 weeks.  Therefore, he will not be at home to get his medication being sent by mailorder.  Please call in a 30 day supply to [facility] of the lisinopril 2.5.

## 2015-03-27 NOTE — Telephone Encounter (Signed)
-----   Message from Lars MassonKatarina H Nelson, MD sent at 03/27/2015  1:33 PM EST ----- His LVEF is mildly decreased at 40-45%, I would start him on low dose lisinopril 2.5 mg po daily and follow BMP in 1 month.

## 2015-03-27 NOTE — Telephone Encounter (Signed)
Notified the pt that per Dr Delton SeeNelson, his echo showed that his LVEF is mildly decreased at 40-45 %, and she recommends that the pt start taking low dose lisinopril 2.5 mg po daily, and come in for a BMET in one month.  Confirmed the pharmacy of choice with the pt.  Scheduled the pt for a BMET at our office for 04/26/15, as the pt requested this date.  Pt verbalized understanding and agrees with this plan.

## 2015-03-27 NOTE — Telephone Encounter (Signed)
Contacted the pt back to inform him that I cancelled the lisinopril sent to his mail order service and [facility] as requested by the pt.  Informed the pt that when his 30 day supply starts running low, he should contact our office back and request to speak with our refill dept, so they can send this prescription to his mail order service for a 90 day supply.  Pt verbalized understanding and gracious for all the assistance provided.

## 2015-04-24 ENCOUNTER — Ambulatory Visit (INDEPENDENT_AMBULATORY_CARE_PROVIDER_SITE_OTHER): Payer: Medicare Other | Admitting: General Practice

## 2015-04-24 DIAGNOSIS — I4891 Unspecified atrial fibrillation: Secondary | ICD-10-CM

## 2015-04-24 DIAGNOSIS — Z5181 Encounter for therapeutic drug level monitoring: Secondary | ICD-10-CM

## 2015-04-24 LAB — POCT INR: INR: 1.6

## 2015-04-24 NOTE — Progress Notes (Signed)
I have reviewed and agree with the plan. 

## 2015-04-24 NOTE — Progress Notes (Signed)
Pre visit review using our clinic review tool, if applicable. No additional management support is needed unless otherwise documented below in the visit note. Patient INR low today.  Boosted for 2 days and continued patient on current regimen.  [place] going to [place] in [place] several [place] and will have INR checked at a clinic there in 2 weeks.  Patient will contact this office upon return to [place].  Stressed the importance of taking medication about the same time every day to avoid unwanted side effects.  Patient verbalized understanding.

## 2015-04-26 ENCOUNTER — Other Ambulatory Visit (INDEPENDENT_AMBULATORY_CARE_PROVIDER_SITE_OTHER): Payer: Medicare Other | Admitting: *Deleted

## 2015-04-26 DIAGNOSIS — R931 Abnormal findings on diagnostic imaging of heart and coronary circulation: Secondary | ICD-10-CM

## 2015-04-26 DIAGNOSIS — I10 Essential (primary) hypertension: Secondary | ICD-10-CM | POA: Diagnosis not present

## 2015-04-26 DIAGNOSIS — I4891 Unspecified atrial fibrillation: Secondary | ICD-10-CM | POA: Diagnosis not present

## 2015-04-26 DIAGNOSIS — R9439 Abnormal result of other cardiovascular function study: Secondary | ICD-10-CM | POA: Diagnosis not present

## 2015-04-26 LAB — BASIC METABOLIC PANEL
BUN: 10 mg/dL (ref 7–25)
CO2: 25 mmol/L (ref 20–31)
Calcium: 9.3 mg/dL (ref 8.6–10.3)
Chloride: 97 mmol/L — ABNORMAL LOW (ref 98–110)
Creat: 0.85 mg/dL (ref 0.70–1.11)
Glucose, Bld: 76 mg/dL (ref 65–99)
Potassium: 4.5 mmol/L (ref 3.5–5.3)
Sodium: 132 mmol/L — ABNORMAL LOW (ref 135–146)

## 2015-04-30 ENCOUNTER — Telehealth: Payer: Self-pay | Admitting: *Deleted

## 2015-04-30 MED ORDER — FUROSEMIDE 20 MG PO TABS: 20.0000 mg | ORAL_TABLET | ORAL | Status: DC

## 2015-04-30 NOTE — Telephone Encounter (Signed)
-----   Message from Lars Masson, MD sent at 04/29/2015  9:23 AM EST ----- Please advise him to take lasix only every other day (because of hyponatremia) and only to increase back to daily dose if he notices LE edema or worsening DOE

## 2015-04-30 NOTE — Telephone Encounter (Signed)
Notified the pt and the daughter that per Dr Delton See, the pts labs showed that his sodium level is low (hyponatremia), and because of this, she recommends the pt to take lasix only EOD, and only to increase back to daily dose if he notices LEE or worsening DOE.  Per the pt and the daughter the pt requires no refills at this time, for he has enough on hand.  Informed the pt and daughter that I will update this med change in his chart for all his providers to visualize.  Pt and daughter verbalized understanding and agrees with this plan.

## 2015-05-01 ENCOUNTER — Telehealth: Payer: Self-pay | Admitting: Cardiology

## 2015-05-01 MED ORDER — FUROSEMIDE 20 MG PO TABS: 20.0000 mg | ORAL_TABLET | ORAL | Status: DC

## 2015-05-01 NOTE — Telephone Encounter (Signed)
Daughter calling to request that the pt will need a refill on his lasix 20 mg po EOD.  Daughter states she forgot to request this, when we spoke yesterday.  Daughter requesting that the pt have a 30 day supply of lasix to be sent to [facility], for he is completely out, and then she will call our office back when he gets low on this supply, to submit to his mail order [facility].  Confirmed the pharmacy of choice with the daughter.  Daughter verbalized understanding and gracious for all the assistance provided.

## 2015-05-01 NOTE — Telephone Encounter (Signed)
New message      Daughter calling back speak to nurse from yesterday conversations.

## 2015-05-08 ENCOUNTER — Ambulatory Visit (INDEPENDENT_AMBULATORY_CARE_PROVIDER_SITE_OTHER): Payer: Medicare Other | Admitting: General Practice

## 2015-05-08 DIAGNOSIS — I4891 Unspecified atrial fibrillation: Secondary | ICD-10-CM | POA: Diagnosis not present

## 2015-05-08 DIAGNOSIS — Z5181 Encounter for therapeutic drug level monitoring: Secondary | ICD-10-CM | POA: Diagnosis not present

## 2015-05-08 LAB — POCT INR: INR: 1.8

## 2015-05-08 NOTE — Progress Notes (Signed)
Pre visit review using our clinic review tool, if applicable. No additional management support is needed unless otherwise documented below in the visit note. 

## 2015-05-09 NOTE — Progress Notes (Signed)
Agree with stated plan. Elizabeth A Crawford, MD  

## 2015-05-15 ENCOUNTER — Telehealth: Payer: Self-pay | Admitting: Cardiology

## 2015-05-15 ENCOUNTER — Other Ambulatory Visit: Payer: Self-pay | Admitting: *Deleted

## 2015-05-15 MED ORDER — FUROSEMIDE 20 MG PO TABS: 20.0000 mg | ORAL_TABLET | ORAL | Status: DC

## 2015-05-15 NOTE — Telephone Encounter (Signed)
°*  STAT* If patient is at the pharmacy, call can be transferred to refill team.   1. Which medications need to be refilled? (please list name of each medication and dose if known) Furosemide 20 mg  2. Which pharmacy/location (including street and city if local pharmacy) is medication to be sent to?[facility]  3. Do they need a 30 day or 90 day supply? 90 and refills

## 2015-06-11 ENCOUNTER — Telehealth: Payer: Self-pay | Admitting: General Practice

## 2015-06-11 ENCOUNTER — Ambulatory Visit (INDEPENDENT_AMBULATORY_CARE_PROVIDER_SITE_OTHER): Payer: Medicare Other | Admitting: General Practice

## 2015-06-11 DIAGNOSIS — Z5181 Encounter for therapeutic drug level monitoring: Secondary | ICD-10-CM | POA: Diagnosis not present

## 2015-06-11 DIAGNOSIS — I4891 Unspecified atrial fibrillation: Secondary | ICD-10-CM

## 2015-06-11 LAB — PROTIME-INR: INR: 1.8 — AB (ref 0.9–1.1)

## 2015-06-11 NOTE — Progress Notes (Signed)
I have reviewed and agree with the plan. 

## 2015-06-11 NOTE — Telephone Encounter (Signed)
Order to check INR faxed to [facility].

## 2015-06-11 NOTE — Progress Notes (Signed)
Pre visit review using our clinic review tool, if applicable. No additional management support is needed unless otherwise documented below in the visit note. 

## 2015-06-12 ENCOUNTER — Other Ambulatory Visit: Payer: Self-pay | Admitting: Internal Medicine

## 2015-07-12 ENCOUNTER — Ambulatory Visit (INDEPENDENT_AMBULATORY_CARE_PROVIDER_SITE_OTHER): Payer: Medicare Other | Admitting: General Practice

## 2015-07-12 DIAGNOSIS — I4891 Unspecified atrial fibrillation: Secondary | ICD-10-CM

## 2015-07-12 DIAGNOSIS — Z5181 Encounter for therapeutic drug level monitoring: Secondary | ICD-10-CM | POA: Diagnosis not present

## 2015-07-12 LAB — POCT INR: INR: 2.5

## 2015-07-12 NOTE — Progress Notes (Signed)
Pre visit review using our clinic review tool, if applicable. No additional management support is needed unless otherwise documented below in the visit note. 

## 2015-07-12 NOTE — Progress Notes (Signed)
I have reviewed and agree with the plan. 

## 2015-08-09 ENCOUNTER — Other Ambulatory Visit: Payer: Self-pay | Admitting: Internal Medicine

## 2015-08-09 ENCOUNTER — Ambulatory Visit (INDEPENDENT_AMBULATORY_CARE_PROVIDER_SITE_OTHER): Payer: Medicare Other | Admitting: General Practice

## 2015-08-09 DIAGNOSIS — Z5181 Encounter for therapeutic drug level monitoring: Secondary | ICD-10-CM | POA: Diagnosis not present

## 2015-08-09 DIAGNOSIS — I4891 Unspecified atrial fibrillation: Secondary | ICD-10-CM | POA: Diagnosis not present

## 2015-08-09 LAB — POCT INR: INR: 3.7

## 2015-08-09 NOTE — Progress Notes (Signed)
I have reviewed and agree with the plan. 

## 2015-08-09 NOTE — Progress Notes (Signed)
Pre visit review using our clinic review tool, if applicable. No additional management support is needed unless otherwise documented below in the visit note. 

## 2015-08-22 DIAGNOSIS — H524 Presbyopia: Secondary | ICD-10-CM | POA: Diagnosis not present

## 2015-08-22 DIAGNOSIS — H40012 Open angle with borderline findings, low risk, left eye: Secondary | ICD-10-CM | POA: Diagnosis not present

## 2015-08-22 DIAGNOSIS — H35372 Puckering of macula, left eye: Secondary | ICD-10-CM | POA: Diagnosis not present

## 2015-08-22 DIAGNOSIS — H04123 Dry eye syndrome of bilateral lacrimal glands: Secondary | ICD-10-CM | POA: Diagnosis not present

## 2015-09-13 ENCOUNTER — Other Ambulatory Visit (INDEPENDENT_AMBULATORY_CARE_PROVIDER_SITE_OTHER): Payer: Medicare Other

## 2015-09-13 ENCOUNTER — Ambulatory Visit: Payer: Medicare Other

## 2015-09-13 ENCOUNTER — Ambulatory Visit (INDEPENDENT_AMBULATORY_CARE_PROVIDER_SITE_OTHER): Payer: Medicare Other | Admitting: Internal Medicine

## 2015-09-13 ENCOUNTER — Ambulatory Visit (INDEPENDENT_AMBULATORY_CARE_PROVIDER_SITE_OTHER): Payer: Medicare Other | Admitting: General Practice

## 2015-09-13 ENCOUNTER — Encounter: Payer: Self-pay | Admitting: Internal Medicine

## 2015-09-13 VITALS — BP 132/70 | HR 36 | Temp 98.0°F | Resp 18 | Ht 71.0 in | Wt 208.0 lb

## 2015-09-13 DIAGNOSIS — I481 Persistent atrial fibrillation: Secondary | ICD-10-CM

## 2015-09-13 DIAGNOSIS — Z5181 Encounter for therapeutic drug level monitoring: Secondary | ICD-10-CM

## 2015-09-13 DIAGNOSIS — R0600 Dyspnea, unspecified: Secondary | ICD-10-CM | POA: Diagnosis not present

## 2015-09-13 DIAGNOSIS — Z0000 Encounter for general adult medical examination without abnormal findings: Secondary | ICD-10-CM | POA: Diagnosis not present

## 2015-09-13 DIAGNOSIS — I4891 Unspecified atrial fibrillation: Secondary | ICD-10-CM

## 2015-09-13 DIAGNOSIS — I4819 Other persistent atrial fibrillation: Secondary | ICD-10-CM

## 2015-09-13 DIAGNOSIS — N400 Enlarged prostate without lower urinary tract symptoms: Secondary | ICD-10-CM

## 2015-09-13 DIAGNOSIS — R931 Abnormal findings on diagnostic imaging of heart and coronary circulation: Secondary | ICD-10-CM

## 2015-09-13 DIAGNOSIS — E785 Hyperlipidemia, unspecified: Secondary | ICD-10-CM | POA: Diagnosis not present

## 2015-09-13 LAB — COMPREHENSIVE METABOLIC PANEL
ALBUMIN: 4.2 g/dL (ref 3.5–5.2)
ALK PHOS: 92 U/L (ref 39–117)
ALT: 14 U/L (ref 0–53)
AST: 19 U/L (ref 0–37)
BILIRUBIN TOTAL: 0.7 mg/dL (ref 0.2–1.2)
BUN: 16 mg/dL (ref 6–23)
CO2: 30 mEq/L (ref 19–32)
CREATININE: 0.98 mg/dL (ref 0.40–1.50)
Calcium: 9.5 mg/dL (ref 8.4–10.5)
Chloride: 95 mEq/L — ABNORMAL LOW (ref 96–112)
GFR: 76.99 mL/min (ref >60.00)
Glucose, Bld: 83 mg/dL (ref 70–99)
Potassium: 5.1 mEq/L (ref 3.5–5.1)
SODIUM: 133 meq/L — AB (ref 135–145)
TOTAL PROTEIN: 7.2 g/dL (ref 6.0–8.3)

## 2015-09-13 LAB — LIPID PANEL
CHOLESTEROL: 134 mg/dL (ref 0–200)
HDL: 50.6 mg/dL (ref >39.00)
LDL Cholesterol: 73 mg/dL (ref 0–99)
NonHDL: 83.5
TRIGLYCERIDES: 52 mg/dL (ref 0.0–149.0)
Total CHOL/HDL Ratio: 3
VLDL: 10.4 mg/dL (ref 0.0–40.0)

## 2015-09-13 LAB — CBC
HCT: 41 % (ref 39.0–52.0)
Hemoglobin: 13.4 g/dL (ref 13.0–17.0)
MCHC: 32.7 g/dL (ref 30.0–36.0)
MCV: 90.7 fl (ref 78.0–100.0)
Platelets: 188 10*3/uL (ref 150.0–400.0)
RBC: 4.52 Mil/uL (ref 4.22–5.81)
RDW: 15.1 % (ref 11.5–15.5)
WBC: 8.1 10*3/uL (ref 4.0–10.5)

## 2015-09-13 LAB — POCT INR: INR: 2.2

## 2015-09-13 NOTE — Progress Notes (Signed)
Pre visit review using our clinic review tool, if applicable. No additional management support is needed unless otherwise documented below in the visit note. 

## 2015-09-13 NOTE — Assessment & Plan Note (Signed)
HR 52 on auscultation so will leave atenolol dosing the same.

## 2015-09-13 NOTE — Assessment & Plan Note (Signed)
Aged out of colonoscopy and immunizations up to date. We did counsel on fall safety and using night lights in the dark since his night time vision is poor. Given 10 year screening recommendations.

## 2015-09-13 NOTE — Assessment & Plan Note (Signed)
Has started on lisinopril without problems and checking CMP today. Also taking lasix although he has not had any swelling in the past. Will check with cardiology if they want him to continue as his breathing has not improved and urination is excessive and disruptive to him.

## 2015-09-13 NOTE — Assessment & Plan Note (Signed)
EF is mildly reduced but starting on lasix per cardiology EOD has not helped at all with his dypnea. Will ask cardiology if he can stop the lasix as the excessive urination is limiting his QOL and has not changed his symptoms.

## 2015-09-13 NOTE — Progress Notes (Signed)
   Subjective:    Patient ID: Colin Miller, male    DOB: 07/29/1926, 82 y.o.   MRN: [id number]  HPI Here for medicare wellness, no new complaints. Please see A/P for status and treatment of chronic medical problems.   Diet: heart healthy  Physical activity: sedentary Depression/mood screen: negative Hearing: intact to whispered voice, mild loss Visual acuity: grossly normal with lens, performs annual eye exam  ADLs: capable Fall risk: low with cane Home safety: good, uses cane Cognitive evaluation: intact to orientation, naming, recall and repetition EOL planning: adv directives discussed  I have personally reviewed and have noted 1. The patient's medical and social history - reviewed today no changes 2. Their use of alcohol, tobacco or illicit drugs 3. Their current medications and supplements 4. The patient's functional ability including ADL's, fall risks, home safety risks and hearing or visual impairment. 5. Diet and physical activities 6. Evidence for depression or mood disorders 7. Care team reviewed and updated (available in snapshot)  Review of Systems  Constitutional: Negative for fever, activity change, appetite change, fatigue and unexpected weight change.  HENT: Negative.   Eyes: Negative.   Respiratory: Positive for shortness of breath. Negative for cough, chest tightness and wheezing.   Cardiovascular: Negative for chest pain, palpitations and leg swelling.  Gastrointestinal: Negative for nausea, abdominal pain, diarrhea, constipation and abdominal distention.  Musculoskeletal: Positive for arthralgias. Negative for back pain and gait problem.  Skin: Negative.   Neurological: Negative.   Psychiatric/Behavioral: Negative.       Objective:   Physical Exam  Constitutional: He is oriented to person, place, and time. He appears well-developed and well-nourished.  HENT:  Head: Normocephalic and atraumatic.  Eyes: EOM are normal.  Neck: Normal range of motion.    Cardiovascular: Normal rate.   Irregularly irregular  Pulmonary/Chest: Effort normal and breath sounds normal. No respiratory distress. He has no wheezes. He has no rales.  Abdominal: Soft. Bowel sounds are normal. He exhibits no distension. There is no tenderness. There is no rebound.  Musculoskeletal: He exhibits no edema.  Neurological: He is alert and oriented to person, place, and time. Coordination normal.  Skin: Skin is warm and dry.  Psychiatric: He has a normal mood and affect.   Filed Vitals:   09/13/15 1029  BP: 132/70  Pulse: 36  Temp: 98 F (36.7 C)  TempSrc: Oral  Resp: 18  Height: 5\' 11"  (1.803 m)  Weight: 208 lb (94.348 kg)  SpO2: 98%      Assessment & Plan:

## 2015-09-13 NOTE — Progress Notes (Signed)
I have reviewed and agree with the plan. 

## 2015-09-13 NOTE — Patient Instructions (Addendum)
It is okay to stop the flomax (tamsulosin) and the proscar (finasteride).  We will leave the atenolol the same.   We will talk to Dr. Delton SeeNelson and see if it is okay to decrease the fluid pill.   Health Maintenance, Male A healthy lifestyle and preventative care can promote health and wellness.  Maintain regular health, dental, and eye exams.  Eat a healthy diet. Foods like vegetables, fruits, whole grains, low-fat dairy products, and lean protein foods contain the nutrients you need and are low in calories. Decrease your intake of foods high in solid fats, added sugars, and salt. Get information about a proper diet from your health care provider, if necessary.  Regular physical exercise is one of the most important things you can do for your health. Most adults should get at least 150 minutes of moderate-intensity exercise (any activity that increases your heart rate and causes you to sweat) each week. In addition, most adults need muscle-strengthening exercises on 2 or more days a week.   Maintain a healthy weight. The body mass index (BMI) is a screening tool to identify possible weight problems. It provides an estimate of body fat based on height and weight. Your health care provider can find your BMI and can help you achieve or maintain a healthy weight. For males 20 years and older:  A BMI below 18.5 is considered underweight.  A BMI of 18.5 to 24.9 is normal.  A BMI of 25 to 29.9 is considered overweight.  A BMI of 30 and above is considered obese.  Maintain normal blood lipids and cholesterol by exercising and minimizing your intake of saturated fat. Eat a balanced diet with plenty of fruits and vegetables. Blood tests for lipids and cholesterol should begin at age 80 and be repeated every 5 years. If your lipid or cholesterol levels are high, you are over age 80, or you are at high risk for heart disease, you may need your cholesterol levels checked more frequently.Ongoing high lipid  and cholesterol levels should be treated with medicines if diet and exercise are not working.  If you smoke, find out from your health care provider how to quit. If you do not use tobacco, do not start.  Lung cancer screening is recommended for adults aged 55-80 years who are at high risk for developing lung cancer because of a history of smoking. A yearly low-dose CT scan of the lungs is recommended for people who have at least a 30-pack-year history of smoking and are current smokers or have quit within the past 15 years. A pack year of smoking is smoking an average of 1 pack of cigarettes a day for 1 year (for example, a 30-pack-year history of smoking could mean smoking 1 pack a day for 30 years or 2 packs a day for 15 years). Yearly screening should continue until the smoker has stopped smoking for at least 15 years. Yearly screening should be stopped for people who develop a health problem that would prevent them from having lung cancer treatment.  If you choose to drink alcohol, do not have more than 2 drinks per day. One drink is considered to be 12 oz (360 mL) of beer, 5 oz (150 mL) of wine, or 1.5 oz (45 mL) of liquor.  Avoid the use of street drugs. Do not share needles with anyone. Ask for help if you need support or instructions about stopping the use of drugs.  High blood pressure causes heart disease and increases the risk  of stroke. High blood pressure is more likely to develop in:  People who have blood pressure in the end of the normal range (100-139/85-89 mm Hg).  People who are overweight or obese.  People who are African American.  If you are 34-61 years of age, have your blood pressure checked every 3-5 years. If you are 39 years of age or older, have your blood pressure checked every year. You should have your blood pressure measured twice--once when you are at a hospital or clinic, and once when you are not at a hospital or clinic. Record the average of the two measurements.  To check your blood pressure when you are not at a hospital or clinic, you can use:  An automated blood pressure machine at a pharmacy.  A home blood pressure monitor.  If you are 25-6 years old, ask your health care provider if you should take aspirin to prevent heart disease.  Diabetes screening involves taking a blood sample to check your fasting blood sugar level. This should be done once every 3 years after age 67 if you are at a normal weight and without risk factors for diabetes. Testing should be considered at a younger age or be carried out more frequently if you are overweight and have at least 1 risk factor for diabetes.  Colorectal cancer can be detected and often prevented. Most routine colorectal cancer screening begins at the age of 79 and continues through age 80. However, your health care provider may recommend screening at an earlier age if you have risk factors for colon cancer. On a yearly basis, your health care provider may provide home test kits to check for hidden blood in the stool. A small camera at the end of a tube may be used to directly examine the colon (sigmoidoscopy or colonoscopy) to detect the earliest forms of colorectal cancer. Talk to your health care provider about this at age 48 when routine screening begins. A direct exam of the colon should be repeated every 5-10 years through age 85, unless early forms of precancerous polyps or small growths are found.  People who are at an increased risk for hepatitis B should be screened for this virus. You are considered at high risk for hepatitis B if:  You were born in a country where hepatitis B occurs often. Talk with your health care provider about which countries are considered high risk.  Your parents were born in a high-risk country and you have not received a shot to protect against hepatitis B (hepatitis B vaccine).  You have HIV or AIDS.  You use needles to inject street drugs.  You live with, or have  sex with, someone who has hepatitis B.  You are a man who has sex with other men (MSM).  You get hemodialysis treatment.  You take certain medicines for conditions like cancer, organ transplantation, and autoimmune conditions.  Hepatitis C blood testing is recommended for all people born from [birth year] through 1963 and any individual with known risk factors for hepatitis C.  Healthy men should no longer receive prostate-specific antigen (PSA) blood tests as part of routine cancer screening. Talk to your health care provider about prostate cancer screening.  Testicular cancer screening is not recommended for adolescents or adult males who have no symptoms. Screening includes self-exam, a health care provider exam, and other screening tests. Consult with your health care provider about any symptoms you have or any concerns you have about testicular cancer.  Practice safe sex.  Use condoms and avoid high-risk sexual practices to reduce the spread of sexually transmitted infections (STIs).  You should be screened for STIs, including gonorrhea and chlamydia if:  You are sexually active and are younger than 24 years.  You are older than 24 years, and your health care provider tells you that you are at risk for this type of infection.  Your sexual activity has changed since you were last screened, and you are at an increased risk for chlamydia or gonorrhea. Ask your health care provider if you are at risk.  If you are at risk of being infected with HIV, it is recommended that you take a prescription medicine daily to prevent HIV infection. This is called pre-exposure prophylaxis (PrEP). You are considered at risk if:  You are a man who has sex with other men (MSM).  You are a heterosexual man who is sexually active with multiple partners.  You take drugs by injection.  You are sexually active with a partner who has HIV.  Talk with your health care provider about whether you are at high risk of  being infected with HIV. If you choose to begin PrEP, you should first be tested for HIV. You should then be tested every 3 months for as long as you are taking PrEP.  Use sunscreen. Apply sunscreen liberally and repeatedly throughout the day. You should seek shade when your shadow is shorter than you. Protect yourself by wearing long sleeves, pants, a wide-brimmed hat, and sunglasses year round whenever you are outdoors.  Tell your health care provider of new moles or changes in moles, especially if there is a change in shape or color. Also, tell your health care provider if a mole is larger than the size of a pencil eraser.  A one-time screening for abdominal aortic aneurysm (AAA) and surgical repair of large AAAs by ultrasound is recommended for men aged 65-75 years who are current or former smokers.  Stay current with your vaccines (immunizations).   This information is not intended to replace advice given to you by your health care provider. Make sure you discuss any questions you have with your health care provider.   Document Released: 09/26/2007 Document Revised: 04/20/2014 Document Reviewed: 08/25/2010 Elsevier Interactive Patient Education [employer]

## 2015-09-13 NOTE — Assessment & Plan Note (Signed)
Okay to stop his finasteride and tamsulosin per his request. He is having excessive urination despite these meds with his new fluid pill from cardiology and does not want to take them since they are not helping. No hesitancy or problems urinating.

## 2015-10-07 ENCOUNTER — Other Ambulatory Visit: Payer: Self-pay | Admitting: Internal Medicine

## 2015-10-11 ENCOUNTER — Ambulatory Visit (INDEPENDENT_AMBULATORY_CARE_PROVIDER_SITE_OTHER): Payer: Medicare Other | Admitting: General Practice

## 2015-10-11 ENCOUNTER — Ambulatory Visit (INDEPENDENT_AMBULATORY_CARE_PROVIDER_SITE_OTHER): Payer: Medicare Other | Admitting: Internal Medicine

## 2015-10-11 ENCOUNTER — Encounter: Payer: Self-pay | Admitting: Internal Medicine

## 2015-10-11 VITALS — BP 144/80 | HR 52 | Temp 97.9°F | Resp 18 | Ht 71.0 in | Wt 211.0 lb

## 2015-10-11 DIAGNOSIS — N400 Enlarged prostate without lower urinary tract symptoms: Secondary | ICD-10-CM

## 2015-10-11 DIAGNOSIS — I4891 Unspecified atrial fibrillation: Secondary | ICD-10-CM

## 2015-10-11 DIAGNOSIS — Z5181 Encounter for therapeutic drug level monitoring: Secondary | ICD-10-CM

## 2015-10-11 LAB — POCT INR: INR: 5.6

## 2015-10-11 MED ORDER — TAMSULOSIN HCL 0.4 MG PO CAPS: 0.8000 mg | ORAL_CAPSULE | Freq: Every day | ORAL | Status: DC

## 2015-10-11 NOTE — Progress Notes (Signed)
Pre visit review using our clinic review tool, if applicable. No additional management support is needed unless otherwise documented below in the visit note. 

## 2015-10-11 NOTE — Patient Instructions (Signed)
Think about doing something like the lifeline where you could keep the button on the bedside table in case you need it at night time. They could have your friend's phone number in case they needed to get in the house if you could not go to the door.  We have sent in the flomax to start taking again. Take 1 pill daily for 1-2 weeks and if this helps enough keep taking that way. If you need more help with the night time urination take 2 pills at night time.   Your heart is doing good and the rate is normal today.

## 2015-10-11 NOTE — Progress Notes (Signed)
I have reviewed and agree with the plan. 

## 2015-10-11 NOTE — Progress Notes (Signed)
   Subjective:    Patient ID: Colin Miller, male    DOB: 12/24/1926, 82 y.o.   MRN: [id number]  HPI The patient is an 82 YO man coming in for panic attacks since our last visit. He is concerned about his heart and felt scared over information that he was given. He has not called the cardiologist or returned to their office. Mostly at night time when he does not have distraction. He was concerned about low heart rate last time although it was normal to auscultation on exam. He is not sleeping well. Takes coumadin for his atrial fib.He is worried about falling and not being able to reach phone and having no way for someone to get into his house.   Review of Systems  Constitutional: Negative for fever, activity change, appetite change, fatigue and unexpected weight change.  HENT: Negative.   Eyes: Negative.   Respiratory: Positive for shortness of breath. Negative for cough, chest tightness and wheezing.   Cardiovascular: Negative for chest pain, palpitations and leg swelling.  Gastrointestinal: Negative for nausea, abdominal pain, diarrhea, constipation and abdominal distention.  Musculoskeletal: Positive for arthralgias. Negative for back pain and gait problem.  Skin: Negative.   Neurological: Negative.   Psychiatric/Behavioral: Positive for sleep disturbance. The patient is nervous/anxious.       Objective:   Physical Exam  Constitutional: He is oriented to person, place, and time. He appears well-developed and well-nourished.  HENT:  Head: Normocephalic and atraumatic.  Eyes: EOM are normal.  Neck: Normal range of motion.  Cardiovascular: Normal rate.   Irregularly irregular  Pulmonary/Chest: Effort normal and breath sounds normal. No respiratory distress. He has no wheezes. He has no rales.  Abdominal: Soft. Bowel sounds are normal. He exhibits no distension. There is no tenderness. There is no rebound.  Musculoskeletal: He exhibits no edema.  Neurological: He is alert and oriented  to person, place, and time. Coordination normal.  Skin: Skin is warm and dry.  Psychiatric: He has a normal mood and affect.   Filed Vitals:   10/11/15 0954  BP: 144/80  Pulse: 52  Temp: 97.9 F (36.6 C)  TempSrc: Oral  Resp: 18  Height: 5\' 11"  (1.803 m)  Weight: 211 lb (95.709 kg)  SpO2: 98%      Assessment & Plan:

## 2015-10-11 NOTE — Assessment & Plan Note (Signed)
Reassured him that his heart is not in danger at this time. Talked with him and his friend about life line or similar so he does not have to worry about phone being close by and his friend does have a key and they could call her to let EMS etc in the house. Do not feel he needs anxiety medicine at this time. He was reassured during the visit.

## 2015-10-11 NOTE — Assessment & Plan Note (Signed)
Worsened with more night time urination since off flomax and finasteride. Will start back flomax to help with his night time frequency especially since this is what makes him the most nervous of falling.

## 2015-10-18 ENCOUNTER — Ambulatory Visit (INDEPENDENT_AMBULATORY_CARE_PROVIDER_SITE_OTHER): Payer: Medicare Other | Admitting: General Practice

## 2015-10-18 DIAGNOSIS — I4891 Unspecified atrial fibrillation: Secondary | ICD-10-CM | POA: Diagnosis not present

## 2015-10-18 DIAGNOSIS — Z5181 Encounter for therapeutic drug level monitoring: Secondary | ICD-10-CM | POA: Diagnosis not present

## 2015-10-18 LAB — POCT INR: INR: 3

## 2015-10-18 NOTE — Progress Notes (Signed)
Note and plan of care reviewed and agree with above. I was available for consultation as needed. Myrlene BrokerElizabeth A Crawford, MD

## 2015-10-18 NOTE — Progress Notes (Signed)
Pre visit review using our clinic review tool, if applicable. No additional management support is needed unless otherwise documented below in the visit note. 

## 2015-10-21 ENCOUNTER — Encounter: Payer: Self-pay | Admitting: Internal Medicine

## 2015-10-22 ENCOUNTER — Telehealth: Payer: Self-pay | Admitting: Internal Medicine

## 2015-10-22 ENCOUNTER — Encounter (HOSPITAL_BASED_OUTPATIENT_CLINIC_OR_DEPARTMENT_OTHER): Payer: Self-pay | Admitting: Emergency Medicine

## 2015-10-22 ENCOUNTER — Emergency Department (HOSPITAL_BASED_OUTPATIENT_CLINIC_OR_DEPARTMENT_OTHER)
Admission: EM | Admit: 2015-10-22 | Discharge: 2015-10-22 | Disposition: A | Discharge status: Home / Self Care | Payer: Medicare Other | Attending: Emergency Medicine | Admitting: Emergency Medicine

## 2015-10-22 ENCOUNTER — Emergency Department (HOSPITAL_BASED_OUTPATIENT_CLINIC_OR_DEPARTMENT_OTHER): Payer: Medicare Other

## 2015-10-22 ENCOUNTER — Telehealth: Payer: Self-pay | Admitting: Cardiology

## 2015-10-22 DIAGNOSIS — I10 Essential (primary) hypertension: Secondary | ICD-10-CM

## 2015-10-22 DIAGNOSIS — M1990 Unspecified osteoarthritis, unspecified site: Secondary | ICD-10-CM | POA: Diagnosis not present

## 2015-10-22 DIAGNOSIS — R931 Abnormal findings on diagnostic imaging of heart and coronary circulation: Secondary | ICD-10-CM

## 2015-10-22 DIAGNOSIS — R600 Localized edema: Secondary | ICD-10-CM | POA: Diagnosis not present

## 2015-10-22 DIAGNOSIS — Z79899 Other long term (current) drug therapy: Secondary | ICD-10-CM | POA: Insufficient documentation

## 2015-10-22 DIAGNOSIS — I5033 Acute on chronic diastolic (congestive) heart failure: Secondary | ICD-10-CM

## 2015-10-22 DIAGNOSIS — I509 Heart failure, unspecified: Secondary | ICD-10-CM | POA: Diagnosis not present

## 2015-10-22 DIAGNOSIS — R0602 Shortness of breath: Secondary | ICD-10-CM

## 2015-10-22 LAB — PROTIME-INR
INR: 2.4 — AB (ref 0.00–1.49)
PROTHROMBIN TIME: 25.9 s — AB (ref 11.6–15.2)

## 2015-10-22 LAB — COMPREHENSIVE METABOLIC PANEL
ALBUMIN: 4 g/dL (ref 3.5–5.0)
ALT: 35 U/L (ref 17–63)
AST: 36 U/L (ref 15–41)
Alkaline Phosphatase: 100 U/L (ref 38–126)
Anion gap: 9 (ref 5–15)
BUN: 17 mg/dL (ref 6–20)
CHLORIDE: 99 mmol/L — AB (ref 101–111)
CO2: 26 mmol/L (ref 22–32)
CREATININE: 0.86 mg/dL (ref 0.61–1.24)
Calcium: 8.8 mg/dL — ABNORMAL LOW (ref 8.9–10.3)
GFR calc non Af Amer: >60 mL/min (ref >60)
GLUCOSE: 114 mg/dL — AB (ref 65–99)
Potassium: 3.8 mmol/L (ref 3.5–5.1)
SODIUM: 134 mmol/L — AB (ref 135–145)
Total Bilirubin: 1.7 mg/dL — ABNORMAL HIGH (ref 0.3–1.2)
Total Protein: 7.2 g/dL (ref 6.5–8.1)

## 2015-10-22 LAB — CBC WITH DIFFERENTIAL/PLATELET
Basophils Absolute: 0 10*3/uL (ref 0.0–0.1)
Basophils Relative: 0 %
EOS ABS: 0.1 10*3/uL (ref 0.0–0.7)
Eosinophils Relative: 2 %
HEMATOCRIT: 40.9 % (ref 39.0–52.0)
HEMOGLOBIN: 14.3 g/dL (ref 13.0–17.0)
LYMPHS ABS: 0.7 10*3/uL (ref 0.7–4.0)
Lymphocytes Relative: 9 %
MCH: 30.2 pg (ref 26.0–34.0)
MCHC: 35 g/dL (ref 30.0–36.0)
MCV: 86.5 fL (ref 78.0–100.0)
MONO ABS: 0.6 10*3/uL (ref 0.1–1.0)
MONOS PCT: 8 %
NEUTROS PCT: 81 %
Neutro Abs: 6.7 10*3/uL (ref 1.7–7.7)
Platelets: 159 10*3/uL (ref 150–400)
RBC: 4.73 MIL/uL (ref 4.22–5.81)
RDW: 16.1 % — ABNORMAL HIGH (ref 11.5–15.5)
WBC: 8.2 10*3/uL (ref 4.0–10.5)

## 2015-10-22 LAB — BRAIN NATRIURETIC PEPTIDE: B NATRIURETIC PEPTIDE 5: 719.8 pg/mL — AB (ref 0.0–100.0)

## 2015-10-22 MED ORDER — FUROSEMIDE 10 MG/ML IJ SOLN
40.0000 mg | Freq: Once | INTRAMUSCULAR | Status: AC
  Administered 2015-10-22: 40 mg via INTRAVENOUS
  Filled 2015-10-22: qty 4

## 2015-10-22 NOTE — Discharge Instructions (Signed)
You were seen today for shortness of breath and lower leg swelling. You were given 40 mg of IV Lasix. Restart your Lasix tomorrow morning and take 20 mg each day until you see your cardiologist on July 20. Follow-up with your primary care provider to be seen within 2 days. Have them check your potassium at that visit since you restarted the Lasix.   Return to the emergency department if your shortness of breath worsens, you experience chest pain or cough, dizziness, abdominal pain, fever.  Heart Failure Heart failure is a condition in which the heart has trouble pumping blood. This means your heart does not pump blood efficiently for your body to work well. In some cases of heart failure, fluid may back up into your lungs or you may have swelling (edema) in your lower legs. Heart failure is usually a long-term (chronic) condition. It is important for you to take good care of yourself and follow your health care provider's treatment plan. CAUSES  Some health conditions can cause heart failure. Those health conditions include:  High blood pressure (hypertension). Hypertension causes the heart muscle to work harder than normal. When pressure in the blood vessels is high, the heart needs to pump (contract) with more force in order to circulate blood throughout the body. High blood pressure eventually causes the heart to become stiff and weak.  Coronary artery disease (CAD). CAD is the buildup of cholesterol and fat (plaque) in the arteries of the heart. The blockage in the arteries deprives the heart muscle of oxygen and blood. This can cause chest pain and may lead to a heart attack. High blood pressure can also contribute to CAD.  Heart attack (myocardial infarction). A heart attack occurs when one or more arteries in the heart become blocked. The loss of oxygen damages the muscle tissue of the heart. When this happens, part of the heart muscle dies. The injured tissue does not contract as well and  weakens the heart's ability to pump blood.  Abnormal heart valves. When the heart valves do not open and close properly, it can cause heart failure. This makes the heart muscle pump harder to keep the blood flowing.  Heart muscle disease (cardiomyopathy or myocarditis). Heart muscle disease is damage to the heart muscle from a variety of causes. These can include drug or alcohol abuse, infections, or unknown reasons. These can increase the risk of heart failure.  Lung disease. Lung disease makes the heart work harder because the lungs do not work properly. This can cause a strain on the heart, leading it to fail.  Diabetes. Diabetes increases the risk of heart failure. High blood sugar contributes to high fat (lipid) levels in the blood. Diabetes can also cause slow damage to tiny blood vessels that carry important nutrients to the heart muscle. When the heart does not get enough oxygen and food, it can cause the heart to become weak and stiff. This leads to a heart that does not contract efficiently.  Other conditions can contribute to heart failure. These include abnormal heart rhythms, thyroid problems, and low blood counts (anemia). Certain unhealthy behaviors can increase the risk of heart failure, including:  Being overweight.  Smoking or chewing tobacco.  Eating foods high in fat and cholesterol.  Abusing illicit drugs or alcohol.  Lacking physical activity. SYMPTOMS  Heart failure symptoms may vary and can be hard to detect. Symptoms may include:  Shortness of breath with activity, such as climbing stairs.  Persistent cough.  Swelling of  the feet, ankles, legs, or abdomen.  Unexplained weight gain.  Difficulty breathing when lying flat (orthopnea).  Waking from sleep because of the need to sit up and get more air.  Rapid heartbeat.  Fatigue and loss of energy.  Feeling light-headed, dizzy, or close to fainting.  Loss of appetite.  Nausea.  Increased urination  during the night (nocturia). DIAGNOSIS  A diagnosis of heart failure is based on your history, symptoms, physical examination, and diagnostic tests. Diagnostic tests for heart failure may include:  Echocardiography.  Electrocardiography.  Chest X-ray.  Blood tests.  Exercise stress test.  Cardiac angiography.  Radionuclide scans. TREATMENT  Treatment is aimed at managing the symptoms of heart failure. Medicines, behavioral changes, or surgical intervention may be necessary to treat heart failure.  Medicines to help treat heart failure may include:  Angiotensin-converting enzyme (ACE) inhibitors. This type of medicine blocks the effects of a blood protein called angiotensin-converting enzyme. ACE inhibitors relax (dilate) the blood vessels and help lower blood pressure.  Angiotensin receptor blockers (ARBs). This type of medicine blocks the actions of a blood protein called angiotensin. Angiotensin receptor blockers dilate the blood vessels and help lower blood pressure.  Water pills (diuretics). Diuretics cause the kidneys to remove salt and water from the blood. The extra fluid is removed through urination. This loss of extra fluid lowers the volume of blood the heart pumps.  Beta blockers. These prevent the heart from beating too fast and improve heart muscle strength.  Digitalis. This increases the force of the heartbeat.  Healthy behavior changes include:  Obtaining and maintaining a healthy weight.  Stopping smoking or chewing tobacco.  Eating heart-healthy foods.  Limiting or avoiding alcohol.  Stopping illicit drug use.  Physical activity as directed by your health care provider.  Surgical treatment for heart failure may include:  A procedure to open blocked arteries, repair damaged heart valves, or remove damaged heart muscle tissue.  A pacemaker to improve heart muscle function and control certain abnormal heart rhythms.  An internal cardioverter  defibrillator to treat certain serious abnormal heart rhythms.  A left ventricular assist device (LVAD) to assist the pumping ability of the heart. HOME CARE INSTRUCTIONS   Take medicines only as directed by your health care provider. Medicines are important in reducing the workload of your heart, slowing the progression of heart failure, and improving your symptoms.  Do not stop taking your medicine unless directed by your health care provider.  Do not skip any dose of medicine.  Refill your prescriptions before you run out of medicine. Your medicines are needed every day.  Engage in moderate physical activity if directed by your health care provider. Moderate physical activity can benefit some people. The elderly and people with severe heart failure should consult with a health care provider for physical activity recommendations.  Eat heart-healthy foods. Food choices should be free of trans fat and low in saturated fat, cholesterol, and salt (sodium). Healthy choices include fresh or frozen fruits and vegetables, fish, lean meats, legumes, fat-free or low-fat dairy products, and whole grain or high fiber foods. Talk to a dietitian to learn more about heart-healthy foods.  Limit sodium if directed by your health care provider. Sodium restriction may reduce symptoms of heart failure in some people. Talk to a dietitian to learn more about heart-healthy seasonings.  Use healthy cooking methods. Healthy cooking methods include roasting, grilling, broiling, baking, poaching, steaming, or stir-frying. Talk to a dietitian to learn more about healthy cooking methods.  Limit fluids if directed by your health care provider. Fluid restriction may reduce symptoms of heart failure in some people.  Weigh yourself every day. Daily weights are important in the early recognition of excess fluid. You should weigh yourself every morning after you urinate and before you eat breakfast. Wear the same amount of  clothing each time you weigh yourself. Record your daily weight. Provide your health care provider with your weight record.  Monitor and record your blood pressure if directed by your health care provider.  Check your pulse if directed by your health care provider.  Lose weight if directed by your health care provider. Weight loss may reduce symptoms of heart failure in some people.  Stop smoking or chewing tobacco. Nicotine makes your heart work harder by causing your blood vessels to constrict. Do not use nicotine gum or patches before talking to your health care provider.  Keep all follow-up visits as directed by your health care provider. This is important.  Limit alcohol intake to no more than 1 drink per day for nonpregnant women and 2 drinks per day for men. One drink equals 12 ounces of beer, 5 ounces of wine, or 1 ounces of hard liquor. Drinking more than that is harmful to your heart. Tell your health care provider if you drink alcohol several times a week. Talk with your health care provider about whether alcohol is safe for you. If your heart has already been damaged by alcohol or you have severe heart failure, drinking alcohol should be stopped completely.  Stop illicit drug use.  Stay up-to-date with immunizations. It is especially important to prevent respiratory infections through current pneumococcal and influenza immunizations.  Manage other health conditions such as hypertension, diabetes, thyroid disease, or abnormal heart rhythms as directed by your health care provider.  Learn to manage stress.  Plan rest periods when fatigued.  Learn strategies to manage high temperatures. If the weather is extremely hot:  Avoid vigorous physical activity.  Use air conditioning or fans or seek a cooler location.  Avoid caffeine and alcohol.  Wear loose-fitting, lightweight, and light-colored clothing.  Learn strategies to manage cold temperatures. If the weather is extremely  cold:  Avoid vigorous physical activity.  Layer clothes.  Wear mittens or gloves, a hat, and a scarf when going outside.  Avoid alcohol.  Obtain ongoing education and support as needed.  Participate in or seek rehabilitation as needed to maintain or improve independence and quality of life. SEEK MEDICAL CARE IF:   You have a rapid weight gain.  You have increasing shortness of breath that is unusual for you.  You are unable to participate in your usual physical activities.  You tire easily.  You cough more than normal, especially with physical activity.  You have any or more swelling in areas such as your hands, feet, ankles, or abdomen.  You are unable to sleep because it is hard to breathe.  You feel like your heart is beating fast (palpitations).  You become dizzy or light-headed upon standing up. SEEK IMMEDIATE MEDICAL CARE IF:   You have difficulty breathing.  There is a change in mental status such as decreased alertness or difficulty with concentration.  You have a pain or discomfort in your chest.  You have an episode of fainting (syncope). MAKE SURE YOU:   Understand these instructions.  Will watch your condition.  Will get help right away if you are not doing well or get worse.   This information is  not intended to replace advice given to you by your health care provider. Make sure you discuss any questions you have with your health care provider.   Document Released: 03/30/2005 Document Revised: 08/14/2014 Document Reviewed: 04/29/2012 Elsevier Interactive Patient Education 2016 [employer]  Edema Edema is an abnormal buildup of fluids in your bodytissues. Edema is somewhatdependent on gravity to pull the fluid to the lowest place in your body. That makes the condition more common in the legs and thighs (lower extremities). Painless swelling of the feet and ankles is common and becomes more likely as you get older. It is also common in looser  tissues, like around your eyes.  When the affected area is squeezed, the fluid may move out of that spot and leave a dent for a few moments. This dent is called pitting.  CAUSES  There are many possible causes of edema. Eating too much salt and being on your feet or sitting for a long time can cause edema in your legs and ankles. Hot weather may make edema worse. Common medical causes of edema include:  Heart failure.  Liver disease.  Kidney disease.  Weak blood vessels in your legs.  Cancer.  An injury.  Pregnancy.  Some medications.  Obesity. SYMPTOMS  Edema is usually painless.Your skin may look swollen or shiny.  DIAGNOSIS  Your health care provider may be able to diagnose edema by asking about your medical history and doing a physical exam. You may need to have tests such as X-rays, an electrocardiogram, or blood tests to check for medical conditions that may cause edema.  TREATMENT  Edema treatment depends on the cause. If you have heart, liver, or kidney disease, you need the treatment appropriate for these conditions. General treatment may include:  Elevation of the affected body part above the level of your heart.  Compression of the affected body part. Pressure from elastic bandages or support stockings squeezes the tissues and forces fluid back into the blood vessels. This keeps fluid from entering the tissues.  Restriction of fluid and salt intake.  Use of a water pill (diuretic). These medications are appropriate only for some types of edema. They pull fluid out of your body and make you urinate more often. This gets rid of fluid and reduces swelling, but diuretics can have side effects. Only use diuretics as directed by your health care provider. HOME CARE INSTRUCTIONS   Keep the affected body part above the level of your heart when you are lying down.   Do not sit still or stand for prolonged periods.   Do not put anything directly under your knees when  lying down.  Do not wear constricting clothing or garters on your upper legs.   Exercise your legs to work the fluid back into your blood vessels. This may help the swelling go down.   Wear elastic bandages or support stockings to reduce ankle swelling as directed by your health care provider.   Eat a low-salt diet to reduce fluid if your health care provider recommends it.   Only take medicines as directed by your health care provider. SEEK MEDICAL CARE IF:   Your edema is not responding to treatment.  You have heart, liver, or kidney disease and notice symptoms of edema.  You have edema in your legs that does not improve after elevating them.   You have sudden and unexplained weight gain. SEEK IMMEDIATE MEDICAL CARE IF:   You develop shortness of breath or chest pain.  You cannot breathe when you lie down.  You develop pain, redness, or warmth in the swollen areas.   You have heart, liver, or kidney disease and suddenly get edema.  You have a fever and your symptoms suddenly get worse. MAKE SURE YOU:   Understand these instructions.  Will watch your condition.  Will get help right away if you are not doing well or get worse.   This information is not intended to replace advice given to you by your health care provider. Make sure you discuss any questions you have with your health care provider.   Document Released: 03/30/2005 Document Revised: 04/20/2014 Document Reviewed: 01/20/2013 Elsevier Interactive Patient Education [employer]

## 2015-10-22 NOTE — ED Notes (Addendum)
Pt reports increased SOB and ankle swelling x 1 week. Denies chest pain, denies recent illness, cough, fever.

## 2015-10-22 NOTE — ED Provider Notes (Signed)
CSN: [id number]     Arrival date & time 10/22/15  1023 History   First MD Initiated Contact with Patient 10/22/15 1038     Chief Complaint  Patient presents with  . Shortness of Breath  . Leg Swelling     (Consider location/radiation/quality/duration/timing/severity/associated sxs/prior Treatment) HPI   Patient is an asymmetry male with a history of CHF, A. Fib who presents to the ED with increased shortness of breath for one month and increased bilateral lower leg swelling for 2 weeks. Patient states increased shortness of breath on exertion with daily activities including going to the mailbox or making breakfast, worse at night and lying flat. Patient states cool air makes his breathing better and when he feels SOB it increases his anxiety. Patient stopped taking his Lasix 2 weeks ago due to increased urination. Patient states his INR 2 weeks ago was 5 and 1 week ago was 3. Patient denies cough, fever, abdominal pain, nausea, vomiting, back pain, headache, dizziness, visual changes.  Past Medical History  Diagnosis Date  . Arthritis   . Chicken pox   . Diverticulitis   . Glaucoma   . Allergy   . Heart murmur   . Colon polyps   . Neuropathy (HCC)     left leg   Past Surgical History  Procedure Laterality Date  . Appendectomy    . Back disc    . Inguinal hernia repair Left 07/24/2014    Procedure: LAPAROSCOPIC LEFT INGUINAL HERNIA REPAIR WITH MESH;  Surgeon: Axel Filler, MD;  Location: [facility];  Service: General;  Laterality: Left;  . Insertion of mesh Left 07/24/2014    Procedure: INSERTION OF MESH;  Surgeon: Axel Filler, MD;  Location: [facility];  Service: General;  Laterality: Left;   Family History  Problem Relation Age of Onset  . Stroke Other   . Heart disease Father   . Cancer Brother     lung, brain tumor  . Diabetes Neg Hx    Social History  Substance Use Topics  . Smoking status: Never Smoker   . Smokeless tobacco: None  . Alcohol Use: Yes     Comment:  occasional beer or glass of wine    Review of Systems  Constitutional: Negative for fever and chills.  HENT: Positive for congestion. Negative for trouble swallowing and voice change.   Eyes: Negative for visual disturbance.  Respiratory: Positive for shortness of breath. Negative for cough and chest tightness.   Cardiovascular: Positive for leg swelling. Negative for chest pain.  Gastrointestinal: Negative for nausea, vomiting, abdominal pain and diarrhea.  Musculoskeletal: Negative for back pain and neck pain.  Skin: Negative for rash.  Neurological: Negative for dizziness, syncope, speech difficulty, weakness, numbness and headaches.  Psychiatric/Behavioral: Negative for confusion.      Allergies  Erythromycin and Gold-containing drug products  Home Medications   Prior to Admission medications   Medication Sig Start Date End Date Taking? Authorizing Provider  atenolol (TENORMIN) 25 MG tablet TAKE 1 TABLET DAILY YEARLY PHYSICAL IS DUE MUST SEE   DOCTOR FOR REFILLS 10/07/15   Myrlene Broker, MD  docusate sodium (COLACE) 100 MG capsule Take 100 mg by mouth at bedtime.    Historical Provider, MD  fexofenadine (ALLEGRA) 180 MG tablet Take 180 mg by mouth daily as needed for allergies or rhinitis.    Historical Provider, MD  JANTOVEN 5 MG tablet TAKE AS DIRECTED BY        ANTICOAGULATION 10/07/15   Myrlene Broker,  MD  lisinopril (PRINIVIL,ZESTRIL) 2.5 MG tablet Take 1 tablet (2.5 mg total) by mouth daily. 03/27/15   Lars Masson, MD  methocarbamol (ROBAXIN) 500 MG tablet Take 1 tablet (500 mg total) by mouth every 8 (eight) hours as needed for muscle spasms. 03/13/15   Myrlene Broker, MD  Multiple Vitamins-Minerals (ICAPS) CAPS Take 2 capsules by mouth 2 (two) times daily.     Historical Provider, MD  NAPROXEN PO Take 1 tablet by mouth as needed (for pain).    Historical Provider, MD  tamsulosin (FLOMAX) 0.4 MG CAPS capsule Take 2 capsules (0.8 mg total) by mouth  daily. 10/11/15   Myrlene Broker, MD   BP 135/98 mmHg  Pulse 65  Temp(Src) 97.7 F (36.5 C) (Oral)  Resp 18  Ht  (1.803 m)  Wt 92.987 kg  BMI 28.60 kg/m2  SpO2 100% Physical Exam  Constitutional: He appears well-developed and well-nourished. No distress.  HENT:  Head: Normocephalic and atraumatic.  Eyes: Conjunctivae are normal.  Cardiovascular: Normal rate and normal heart sounds.  An irregular rhythm present. Exam reveals no gallop and no friction rub.   No murmur heard. Pulses:      Dorsalis pedis pulses are 2+ on the right side, and 2+ on the left side.  Pulmonary/Chest: Effort normal and breath sounds normal. No respiratory distress. He has no decreased breath sounds. He has no wheezes. He has no rhonchi. He has no rales.  Abdominal: Soft. Bowel sounds are normal. He exhibits no distension. There is no tenderness. There is no rebound.  Musculoskeletal:  Sensation intact of bilateral lower extremities, 3+ pitting edema of bilateral lower extremities up to knees  Neurological: He is alert. Coordination normal.  Skin: Skin is warm and dry. No rash noted. He is not diaphoretic.  Psychiatric: He has a normal mood and affect. His behavior is normal.  Nursing note and vitals reviewed.   ED Course  Procedures (including critical care time) Labs Review Labs Reviewed  BRAIN NATRIURETIC PEPTIDE - Abnormal; Notable for the following:    B Natriuretic Peptide 719.8 (*)    All other components within normal limits  COMPREHENSIVE METABOLIC PANEL - Abnormal; Notable for the following:    Sodium 134 (*)    Chloride 99 (*)    Glucose, Bld 114 (*)    Calcium 8.8 (*)    Total Bilirubin 1.7 (*)    All other components within normal limits  CBC WITH DIFFERENTIAL/PLATELET - Abnormal; Notable for the following:    RDW 16.1 (*)    All other components within normal limits  PROTIME-INR - Abnormal; Notable for the following:    Prothrombin Time 25.9 (*)    INR 2.40 (*)    All  other components within normal limits    Imaging Review Dg Chest 2 View  10/22/2015  CLINICAL DATA:  Shortness of breath for 2 weeks, swelling in legs EXAM: CHEST  2 VIEW COMPARISON:  None FINDINGS: Enlargement of cardiac silhouette. Tortuous aorta. Pulmonary vascular congestion. No acute failure or consolidation. Minimal bibasilar atelectasis. Calcified granuloma medial LEFT lower lobe. No pleural effusion or pneumothorax. Bones unremarkable. IMPRESSION: Enlargement of cardiac silhouette with pulmonary vascular congestion. Bibasilar atelectasis and old granulomatous disease without definite acute infiltrate. Electronically Signed   By: Ulyses Southward M.D.   On: 10/22/2015 11:22   I have personally reviewed and evaluated these images and lab results as part of my medical decision-making.   EKG Interpretation   Date/Time:  Tuesday  October 22 2015 10:38:26 EDT Ventricular Rate:  69 PR Interval:    QRS Duration: 101 QT Interval:  423 QTC Calculation: 454 R Axis:   43 Text Interpretation:  Atrial fibrillation Ventricular premature complex  Low voltage, extremity and precordial leads Consider anterior infarct No  previous ECGs available Confirmed by [facility] MD, ERIN ([id number]) on  10/22/2015 10:53:19 AM Also confirmed by [facility], ERIN ([id number]),  editor [place]  CCT, BEVERLY ([id number])  on 10/22/2015 11:14:31 AM      MDM   Final diagnoses:  Acute on chronic congestive heart failure, unspecified congestive heart failure type (HCC)  Shortness of breath  Bilateral lower extremity edema    Patient presentation concerning for acute CHF exacerbation. Patient stopped taking his Lasix 2 weeks ago and he noticed increased shortness of breath on exertion and increased leg swelling. BMP was found to be over 700. Patient with A. fib on chronic Coumadin. Patient was given 40 mg IV Lasix while in the ED. His oxygen saturations remained above 90% upon ambulation in the ED. Instructed patient to restart  his Lasix once daily until he is seen by his cardiologist on July 20. Instructed patient to follow up with his primary care provider within 2 days to be reevaluated. Feel patient is stable for discharge at this time. He seems reliable for follow-up.  Patient's bilirubin mildly elevated unsure of etiology. Instructed patient to follow up with primary care provider and have levels rechecked.  Patient's daughter is staying with the patient at this time. I discussed strict return precautions to the ED. Patient and family expressed understanding to the discharge instructions.  Case discussed and pt seen by Dr. Dalene SeltzerSchlossman who agrees with the above plan.    Jerre SimonJessica L Morenike Cuff, PA 10/24/15 2223  Alvira MondayErin Schlossman, MD 10/28/15 1626

## 2015-10-22 NOTE — ED Notes (Signed)
PA at bedside.

## 2015-10-22 NOTE — Telephone Encounter (Signed)
Patient Name: Colin Miller  DOB: 07/03/1926    Initial Comment Caller states father has trouble breathing and foot swelling.   Nurse Assessment  Nurse: Scarlette ArStandifer, RN, Heather Date/Time (Eastern Time): 10/22/2015 9:13:04 AM  Confirm and document reason for call. If symptomatic, describe symptoms. You must click the next button to save text entered. ---Caller states father has trouble breathing since before he was seen at the office on 06/30 and foot swelling for the last few days, he was on a fluid pill but was taken off of it a few weeks ago.Marland Kitchen.  Has the patient traveled out of the country within the last 30 days? ---Not Applicable  Does the patient have any new or worsening symptoms? ---Yes  Will a triage be completed? ---Yes  Related visit to physician within the last 2 weeks? ---No  Does the PT have any chronic conditions? (i.e. diabetes, asthma, etc.) ---Yes  List chronic conditions. ---See MR  Is this a behavioral health or substance abuse call? ---No     Guidelines    Guideline Title Affirmed Question Affirmed Notes  Leg Swelling and Edema [1] Difficulty breathing with exertion (e.g., walking) AND [2] new onset or worsening    Final Disposition User   Go to ED Now (or PCP triage) Scarlette ArStandifer, RN, Heather    Referrals  [facility]   Disagree/Comply: Comply

## 2015-10-22 NOTE — ED Notes (Signed)
Pt became SOB and both upper part side of legs while ambulating back to the room, Pt used his cane and ambulated without assistance. Pulse was 115 and  SpO2 was 90-93 returning back to room.

## 2015-10-23 ENCOUNTER — Ambulatory Visit: Payer: Medicare Other

## 2015-10-25 ENCOUNTER — Other Ambulatory Visit (INDEPENDENT_AMBULATORY_CARE_PROVIDER_SITE_OTHER): Payer: Medicare Other

## 2015-10-25 ENCOUNTER — Encounter: Payer: Self-pay | Admitting: Internal Medicine

## 2015-10-25 ENCOUNTER — Ambulatory Visit (INDEPENDENT_AMBULATORY_CARE_PROVIDER_SITE_OTHER): Payer: Medicare Other | Admitting: Internal Medicine

## 2015-10-25 VITALS — BP 132/80 | HR 69 | Temp 97.4°F | Wt 205.4 lb

## 2015-10-25 DIAGNOSIS — I5033 Acute on chronic diastolic (congestive) heart failure: Secondary | ICD-10-CM | POA: Diagnosis not present

## 2015-10-25 LAB — BASIC METABOLIC PANEL
BUN: 18 mg/dL (ref 6–23)
CHLORIDE: 95 meq/L — AB (ref 96–112)
CO2: 28 mEq/L (ref 19–32)
Calcium: 9.2 mg/dL (ref 8.4–10.5)
Creatinine, Ser: 0.94 mg/dL (ref 0.40–1.50)
GFR: 80.76 mL/min (ref >60.00)
GLUCOSE: 112 mg/dL — AB (ref 70–99)
POTASSIUM: 3.9 meq/L (ref 3.5–5.1)
Sodium: 134 mEq/L — ABNORMAL LOW (ref 135–145)

## 2015-10-25 NOTE — Progress Notes (Signed)
Pre visit review using our clinic review tool, if applicable. No additional management support is needed unless otherwise documented below in the visit note. 

## 2015-10-25 NOTE — Progress Notes (Signed)
   Subjective:    Patient ID: Colin Miller, male    DOB: 08/07/1926, 82 y.o.   MRN: [id number]  HPI The patient is an 82 YO man coming in for ER follow up (in for acute on chronic diastolic heart failure with BNP 700). We had stopped his lasix as it did not seem to be helping and was started only several months ago. It was worsening his BPH and was affecting his QOL. He has been taking it since ER visit and has noticed loss of about 4 pounds (not reflected on our scales today). He is having less swelling in his legs although the left is still fairly swollen. He is having less SOB but still SOB.   Review of Systems  Constitutional: Negative for fever, activity change, appetite change, fatigue and unexpected weight change.  HENT: Negative.   Eyes: Negative.   Respiratory: Positive for shortness of breath. Negative for cough, chest tightness and wheezing.   Cardiovascular: Positive for leg swelling. Negative for chest pain and palpitations.  Gastrointestinal: Negative for nausea, abdominal pain, diarrhea, constipation and abdominal distention.  Musculoskeletal: Positive for arthralgias. Negative for back pain and gait problem.  Skin: Negative.   Neurological: Negative.   Psychiatric/Behavioral: Negative.       Objective:   Physical Exam  Constitutional: He is oriented to person, place, and time. He appears well-developed and well-nourished.  HENT:  Head: Normocephalic and atraumatic.  Eyes: EOM are normal.  Neck: Normal range of motion. JVD present.  Cardiovascular: Normal rate.   Irregularly irregular  Pulmonary/Chest: Effort normal and breath sounds normal. No respiratory distress. He has no wheezes. He has no rales.  Dyspneic on exam while talking, able to speak in sentences.   Abdominal: Soft. Bowel sounds are normal. He exhibits no distension. There is no tenderness. There is no rebound.  Musculoskeletal: He exhibits edema.  2+ edema left leg to the knee, right leg with 1-2 +  edema to mid shin.   Neurological: He is alert and oriented to person, place, and time. Coordination normal.  Skin: Skin is warm and dry.  Psychiatric: He has a normal mood and affect.   Filed Vitals:   10/25/15 0916  BP: 132/80  Pulse: 69  Temp: 97.4 F (36.3 C)  TempSrc: Oral  Weight: 205 lb 6.4 oz (93.169 kg)  SpO2: 94%      Assessment & Plan:

## 2015-10-25 NOTE — Assessment & Plan Note (Signed)
Still exacerbated today with JVD and swelling on exam with significant dyspnea. Continue with 20 mg lasix since he is down 4 pounds at home. Sees cardiology next week. BMP today and adjust as needed. Suspect cardiology will check BNP and BMP next week as well if needed. Interestingly the ER weight is actually down from his usual at the office but he is overloaded clinically on exam today.

## 2015-10-25 NOTE — Patient Instructions (Signed)
We are checking the potassium levels today and will call back with the results.   Keep taking the lasix 20 mg daily until you see the heart doctor next week.

## 2015-10-31 ENCOUNTER — Ambulatory Visit (INDEPENDENT_AMBULATORY_CARE_PROVIDER_SITE_OTHER): Payer: Medicare Other | Admitting: Physician Assistant

## 2015-10-31 ENCOUNTER — Encounter: Payer: Self-pay | Admitting: Physician Assistant

## 2015-10-31 VITALS — BP 128/80 | HR 86 | Ht 71.0 in | Wt 198.0 lb

## 2015-10-31 DIAGNOSIS — I5043 Acute on chronic combined systolic (congestive) and diastolic (congestive) heart failure: Secondary | ICD-10-CM | POA: Diagnosis not present

## 2015-10-31 DIAGNOSIS — I482 Chronic atrial fibrillation, unspecified: Secondary | ICD-10-CM

## 2015-10-31 DIAGNOSIS — I10 Essential (primary) hypertension: Secondary | ICD-10-CM

## 2015-10-31 LAB — BASIC METABOLIC PANEL
BUN: 17 mg/dL (ref 7–25)
CO2: 28 mmol/L (ref 20–31)
Calcium: 8.8 mg/dL (ref 8.6–10.3)
Chloride: 98 mmol/L (ref 98–110)
Creat: 0.98 mg/dL (ref 0.70–1.11)
Glucose, Bld: 89 mg/dL (ref 65–99)
POTASSIUM: 4.3 mmol/L (ref 3.5–5.3)
SODIUM: 138 mmol/L (ref 135–146)

## 2015-10-31 MED ORDER — ATENOLOL 25 MG PO TABS: 25.0000 mg | ORAL_TABLET | Freq: Every day | ORAL | Status: AC

## 2015-10-31 NOTE — Progress Notes (Signed)
Cardiology Office Note    Date:  10/31/2015  ID:  Colin Miller, DOB [birth year], MRN [id number] PCP:  Myrlene Broker, MD  Cardiologist: Dr. Delton See  Chief Complaint: SOB, edema  History of Present Illness:  Colin Miller is a 82 y.o. male with history of HTN, chronic atrial fibrillation on Coumadin, arthritis, reported chronic diastolic CHF (but EF 40-45% in 03/2015 so ?combined), chronic appearing hyponatremia who presents for f/u of edema. Per notes he established cardiology care with Dr. Delton See in 03/2015 after moving here from [place]. Her note references dx of AF in 2010 s/p unsuccessful cardioversion with normal stress test at that time. 2D Echo 03/2015: EF 40-45%, diffuse HK, unable to eval diastolic parameters due to afib, mild AI, mildly dilated ascending aorta, mild MR, severely dilated LA/RA, mild TR, PASP 39. Based on this echo result Dr. Delton See started lisinopril with recommendation to f/u BMET. His Lasix was decreased to every other day due to hyponatremia and eventually discontinued by primary care due to decreased QOL from increased urination in the setting of BPH.  He recently presented to the ER 10/22/15 with complaints of increased SOB, DOE, and bilateral LEE. He was treated with  IV Lasix and instructed to resume oral Lasix. Labs BNP 719 and INR 2.4. F/u BMET 10/25/15 when seeing his PCP showed stable Cr 0.92, K 3.9, Na 134. His edema has continually improved. His SOB has improved as well, but is still not back to baseline. His family has noticed his endurance has worsened over the last 6-7 months. He denies any palpitations or syncope. He eats a lot of processed foods and sips water all day long.  Past Medical History  Diagnosis Date  . Arthritis   . Chicken pox   . Diverticulitis   . Glaucoma   . Allergy   . Heart murmur   . Colon polyps   . Neuropathy (HCC)     left leg  . Essential hypertension   . Chronic atrial fibrillation (HCC)   . Chronic  combined systolic and diastolic CHF (congestive heart failure) (HCC)     a. 2D Echo 03/2015: EF 40-45%, diffuse HK, unable to eval diastolic parameters due to afib, mild AI, mildly dilated ascending aorta, mild MR, severely dilated LA/RA, mild TR, PASP 39.  Marland Kitchen Hyponatremia   . Chronic anticoagulation     Past Surgical History  Procedure Laterality Date  . Appendectomy    . Back disc    . Inguinal hernia repair Left 07/24/2014    Procedure: LAPAROSCOPIC LEFT INGUINAL HERNIA REPAIR WITH MESH;  Surgeon: Axel Filler, MD;  Location: [facility];  Service: General;  Laterality: Left;  . Insertion of mesh Left 07/24/2014    Procedure: INSERTION OF MESH;  Surgeon: Axel Filler, MD;  Location: [facility];  Service: General;  Laterality: Left;    Current Medications: Current Outpatient Prescriptions  Medication Sig Dispense Refill  . tamsulosin (FLOMAX) 0.4 MG CAPS capsule Take 0.4 mg by mouth daily.    Marland Kitchen acetaminophen (TYLENOL) 500 MG tablet Take 500 mg by mouth every 6 (six) hours as needed for mild pain or moderate pain.     Marland Kitchen atenolol (TENORMIN) 25 MG tablet TAKE 1 TABLET DAILY YEARLY PHYSICAL IS DUE MUST SEE   DOCTOR FOR REFILLS 90 tablet 0  . docusate sodium (COLACE) 100 MG capsule Take 100 mg by mouth at bedtime.    . fexofenadine (ALLEGRA) 180 MG tablet Take 180 mg by mouth daily as needed  for allergies or rhinitis.    . furosemide (LASIX) 20 MG tablet Take 20 mg by mouth daily.    Marland Kitchen. JANTOVEN 5 MG tablet TAKE AS DIRECTED BY        ANTICOAGULATION 105 tablet 1  . lisinopril (PRINIVIL,ZESTRIL) 2.5 MG tablet Take 1 tablet (2.5 mg total) by mouth daily. 30 tablet 1  . methocarbamol (ROBAXIN) 500 MG tablet Take 1 tablet (500 mg total) by mouth every 8 (eight) hours as needed for muscle spasms. 60 tablet 3  . Multiple Vitamins-Minerals (ICAPS) CAPS Take 1 capsule by mouth daily.     Marland Kitchen. NAPROXEN PO Take 1 tablet by mouth as needed (for pain).     No current facility-administered medications for this  visit.     Allergies:   Erythromycin and Gold-containing drug products   Social History   Social History  . Marital Status: Widowed    Spouse Name: N/A  . Number of Children: N/A  . Years of Education: N/A   Social History Main Topics  . Smoking status: Never Smoker   . Smokeless tobacco: None  . Alcohol Use: Yes     Comment: occasional beer or glass of wine  . Drug Use: No  . Sexual Activity: No   Other Topics Concern  . None   Social History Narrative     Family History:  The patient's family history includes Cancer in his brother; Heart disease in his father; Stroke in his other. There is no history of Diabetes.   ROS:   Please see the history of present illness. No bleeding All other systems are reviewed and otherwise negative.    PHYSICAL EXAM:   VS:  BP 128/80 mmHg  Pulse 86  Ht 5\' 11"  (1.803 m)  Wt 198 lb (89.812 kg)  BMI 27.63 kg/m2  SpO2 98%  BMI: Body mass index is 27.63 kg/(m^2). GEN: Well nourished, well developed WM, in no acute distress HEENT: normocephalic, atraumatic Neck: no JVD, carotid bruits, or masses Cardiac: Irregularly irregular; no murmurs, rubs, or gallops, no edema  Respiratory:  Mildly diminished BS L base otherwise clear to auscultation bilaterally, normal work of breathing GI: soft, nontender, nondistended, + BS MS: no deformity or atrophy Skin: warm and dry, no rash Neuro:  Alert and Oriented x 3, Strength and sensation are intact, follows commands Psych: euthymic mood, full affect  Wt Readings from Last 3 Encounters:  10/31/15 198 lb (89.812 kg)  10/25/15 205 lb 6.4 oz (93.169 kg)  10/22/15 205 lb (92.987 kg)      Studies/Labs Reviewed:   EKG:  EKG was ordered today and personally reviewed by me and demonstrates atrial fib with frequent PVCs  Recent Labs: 03/13/2015: Pro B Natriuretic peptide (BNP) 482.0* 10/22/2015: ALT 35; B Natriuretic Peptide 719.8*; Hemoglobin 14.3; Platelets 159 10/25/2015: BUN 18; Creatinine, Ser  0.94; Potassium 3.9; Sodium 134*   Lipid Panel    Component Value Date/Time   CHOL 134 09/13/2015 1128   TRIG 52.0 09/13/2015 1128   HDL 50.60 09/13/2015 1128   CHOLHDL 3 09/13/2015 1128   VLDL 10.4 09/13/2015 1128   LDLCALC 73 09/13/2015 1128    Additional studies/ records that were reviewed today include: Summarized above.     ASSESSMENT & PLAN:   1. Acute on chronic combined CHF - remains with mild volume on board. He reports a baseline weight of 198lb so he is nearing euvolemia. I recommended to continue low dose Lasix daily until edema resolves, at which time I  have told him to change to PRN (edema, SOB, or weight gain). Advised sodium and fluid restriction. I am not convinced of his ability to comply with this as he does not seem to grasp that there is salt in the foods he eats. Given his decreased endurance, I think he would benefit from a stress test to exclude ischemia since his LV function was impaired by echocardiogram in 03/2015. The patient is adamant that he had a stress test in our office. He states that after his echo, he was told the next day to return any time without an appointment for a stress test. We do not do nucs without an appointment - but we do tell patients they can return any time the lab is open without an appointment for their repeat labwork. He does not remember being here for 4 hours for any additional testing. I really think he is confusing the timeline of when he was called with his echo results then told to return any time for his repeat BMET. I do not see a stress test in the computer. I do not see any nuc appointments previously scheduled. I do not see anywhere in his chart where Dr. Delton See advised this. He does not have a second MRN. Despite discussing this, he continues to insist that he had one. His daughter is not sure. I will ask Ivy, Dr. Lindaann Slough nurse, to look into this further - if she affirms that she does not see a stress test either in the system,  then I would recommend a Lexiscan nuc to further assess. Recheck BMET today to ensure stability of Na, K, Cr. 2. Essential HTN - controlled. 3. Chronic atrial fib - maintained on Coumadin, rate controlled.  Disposition: F/u with Dr. Delton See in 2 months.   Medication Adjustments/Labs and Tests Ordered: Current medicines are reviewed at length with the patient today.  Concerns regarding medicines are outlined above. Medication changes, Labs and Tests ordered today are summarized above and listed in the Patient Instructions accessible in Encounters.   Thomasene Mohair PA-C  10/31/2015 4:45 PM    [facility] [address] [place], [place], [place]  [address] Phone: [phone]; Fax: [phone]

## 2015-10-31 NOTE — Patient Instructions (Signed)
Medication Instructions:  Your physician recommends that you continue on your current medications as directed. Please refer to the Current Medication list given to you today.   Labwork: TODAY;  BMET  Testing/Procedures: None ordered  Follow-Up: Your physician recommends that you schedule a follow-up appointment in: 2 MONTHS WITH DR. Delton SeeNELSON   Any Other Special Instructions Will Be Listed Below (If Applicable).  Keep taking the Lasix until the swelling resolves then go as needed for weight gain of 3 lbs in a day or 5 lbs in a week, shortness of breath, or edema   If you need a refill on your cardiac medications before your next appointment, please call your pharmacy.

## 2015-11-01 ENCOUNTER — Telehealth: Payer: Self-pay | Admitting: *Deleted

## 2015-11-01 DIAGNOSIS — H903 Sensorineural hearing loss, bilateral: Secondary | ICD-10-CM | POA: Diagnosis not present

## 2015-11-01 NOTE — Telephone Encounter (Signed)
-----   Message from Laurann Montanaayna N Dunn, [place] sent at 11/01/2015  6:30 AM EDT ----- Please let patient know BMET is totally stable. Sodium level is normal. Ronie Spiesayna Dunn PA-C

## 2015-11-05 ENCOUNTER — Telehealth: Payer: Self-pay | Admitting: Cardiology

## 2015-11-05 NOTE — Telephone Encounter (Signed)
-----   Message from Laurann Montana, PA-C sent at 10/31/2015  5:00 PM EDT ----- Regarding: Help Ivy (or those covering Ivy), please help. I saw this patient of Dr. Lindaann Slough today in clinic for CHF. I recommended he have a stress test because he has had worsening CHF recently and prior EF in 2016 was abnormal. However, he is adamant that he had a stress test but what he is saying does not make sense. He states that after his echo in 03/2015, he was told the next day to return any time without an appointment for a stress test. We do not do nucs without an appointment - but we do tell patients they can return any time the lab is open without an appointment for their repeat labwork. I really think he is confusing the timeline of when he was called with his echo results then told to return any time for his repeat BMET. I do not see a stress test in the computer. I do not see any nuc appointments previously scheduled. I do not see anywhere in his chart where Dr. Delton See advised this. He does not have a second MRN. Despite discussing this, he continues to insist that he had one. His daughter is not sure. I think he would benefit from having a second set of eyes make sure I am not losing my mind, and if no stress test was ever performed, to go ahead and plan Lexiscan nuclear stress test.  Please call him and let him know if you do or do not find a stress test result.  I don't have any other way to think of checking besides if you can call his insurance? I really think he is just confusing the f/u labwork but he would not take my word for it. If he has not had a nuc, needs a Lexiscan. Thanks! Dayna

## 2015-11-05 NOTE — Telephone Encounter (Signed)
New message ° ° ° °Pt dtr returning nurse call. Please call. °

## 2015-11-05 NOTE — Telephone Encounter (Signed)
Pt and daughter came to the office to pick up Lexiscan instructions.  Scheduled the pts Lexiscan for tomorrow 7/26 at 0745.  Will route this message to Ronie Spies PA to make her aware of this.

## 2015-11-05 NOTE — Telephone Encounter (Signed)
Spoke with the pt as Dayna requested and informed him that it appears he has not had a nuclear stress test done.  Informed the pt this was checked in several different areas in his chart.  Informed the pt that if he's ok with this recommendation, then we should proceed with ordering for him to have a lexican done.  Informed the pt that I will place the order in the system and have one of our [facility] schedulers call him back, to have this appt arranged.  Per the pt, he prefers that I call his Daughter Fulton Mole (on [place]) to arrange this appt, for she handles his care. Pt states that he is actually going to the airport now to pick Alice up, and once he gets her, he will then make a stop by our office to pick up his lexiscan instructions and schedule his lexiscan appt.  Informed the pt that would be perfectly fine, and he should have me paged at check-in, once they arrive.  Order is in the system.  Pt verbalized understanding and agrees with this plan.

## 2015-11-06 ENCOUNTER — Ambulatory Visit (INDEPENDENT_AMBULATORY_CARE_PROVIDER_SITE_OTHER): Payer: Medicare Other | Admitting: General Practice

## 2015-11-06 ENCOUNTER — Ambulatory Visit (HOSPITAL_COMMUNITY): Payer: Medicare Other | Attending: Cardiovascular Disease

## 2015-11-06 ENCOUNTER — Ambulatory Visit: Payer: Medicare Other

## 2015-11-06 DIAGNOSIS — I10 Essential (primary) hypertension: Secondary | ICD-10-CM

## 2015-11-06 DIAGNOSIS — I110 Hypertensive heart disease with heart failure: Secondary | ICD-10-CM | POA: Insufficient documentation

## 2015-11-06 DIAGNOSIS — R0609 Other forms of dyspnea: Secondary | ICD-10-CM | POA: Insufficient documentation

## 2015-11-06 DIAGNOSIS — R931 Abnormal findings on diagnostic imaging of heart and coronary circulation: Secondary | ICD-10-CM | POA: Diagnosis not present

## 2015-11-06 DIAGNOSIS — Z5181 Encounter for therapeutic drug level monitoring: Secondary | ICD-10-CM

## 2015-11-06 DIAGNOSIS — R0602 Shortness of breath: Secondary | ICD-10-CM | POA: Insufficient documentation

## 2015-11-06 DIAGNOSIS — R9439 Abnormal result of other cardiovascular function study: Secondary | ICD-10-CM | POA: Diagnosis not present

## 2015-11-06 DIAGNOSIS — I5033 Acute on chronic diastolic (congestive) heart failure: Secondary | ICD-10-CM | POA: Diagnosis not present

## 2015-11-06 LAB — MYOCARDIAL PERFUSION IMAGING
CHL CUP NUCLEAR SSS: 3
CHL CUP RESTING HR STRESS: 80 {beats}/min
LHR: 0.29
Peak HR: 103 {beats}/min
SDS: 3
SRS: 0
TID: 1.01

## 2015-11-06 LAB — POCT INR: INR: 2.5

## 2015-11-06 MED ORDER — TECHNETIUM TC 99M TETROFOSMIN IV KIT
11.0000 | PACK | Freq: Once | INTRAVENOUS | Status: AC | PRN
  Administered 2015-11-06: 11 via INTRAVENOUS
  Filled 2015-11-06: qty 11

## 2015-11-06 MED ORDER — REGADENOSON 0.4 MG/5ML IV SOLN
0.4000 mg | Freq: Once | INTRAVENOUS | Status: AC
  Administered 2015-11-06: 0.4 mg via INTRAVENOUS

## 2015-11-06 MED ORDER — TECHNETIUM TC 99M TETROFOSMIN IV KIT
32.8000 | PACK | Freq: Once | INTRAVENOUS | Status: AC | PRN
  Administered 2015-11-06: 32.8 via INTRAVENOUS
  Filled 2015-11-06: qty 33

## 2015-11-06 NOTE — Progress Notes (Signed)
I have reviewed and agree with the plan. 

## 2015-11-07 ENCOUNTER — Telehealth: Payer: Self-pay | Admitting: Cardiology

## 2015-11-07 NOTE — Telephone Encounter (Signed)
Returned [place], Pts daughter, DPR on file, call. She has been made aware that pts study was low risk, no active ischemia.  If SOB does not continue to improve, ask pt/family to please let Dr. Delton See know.  Keep f/u as planned.  She verbalized understanding.

## 2015-11-07 NOTE — Telephone Encounter (Signed)
Follow-up ° ° ° ° °The pt is returning the nurses call °

## 2015-11-09 ENCOUNTER — Other Ambulatory Visit: Payer: Self-pay | Admitting: Internal Medicine

## 2015-11-15 ENCOUNTER — Ambulatory Visit: Payer: Medicare Other

## 2015-11-19 DIAGNOSIS — I5032 Chronic diastolic (congestive) heart failure: Secondary | ICD-10-CM | POA: Diagnosis not present

## 2015-11-19 DIAGNOSIS — M545 Low back pain: Secondary | ICD-10-CM | POA: Diagnosis not present

## 2015-11-19 DIAGNOSIS — I480 Paroxysmal atrial fibrillation: Secondary | ICD-10-CM | POA: Diagnosis not present

## 2015-11-19 DIAGNOSIS — I10 Essential (primary) hypertension: Secondary | ICD-10-CM | POA: Diagnosis not present

## 2015-11-21 ENCOUNTER — Encounter: Payer: Self-pay | Admitting: Physician Assistant

## 2015-11-27 DIAGNOSIS — I10 Essential (primary) hypertension: Secondary | ICD-10-CM | POA: Diagnosis not present

## 2015-11-27 DIAGNOSIS — I5032 Chronic diastolic (congestive) heart failure: Secondary | ICD-10-CM | POA: Diagnosis not present

## 2015-11-27 DIAGNOSIS — I482 Chronic atrial fibrillation: Secondary | ICD-10-CM | POA: Diagnosis not present

## 2015-11-28 DIAGNOSIS — Z7901 Long term (current) use of anticoagulants: Secondary | ICD-10-CM | POA: Diagnosis not present

## 2015-11-28 DIAGNOSIS — I480 Paroxysmal atrial fibrillation: Secondary | ICD-10-CM | POA: Diagnosis not present

## 2016-01-01 ENCOUNTER — Encounter: Payer: Self-pay | Admitting: Cardiology

## 2016-01-16 ENCOUNTER — Ambulatory Visit: Payer: Medicare Other | Admitting: Cardiology

## 2016-01-21 ENCOUNTER — Ambulatory Visit: Payer: Self-pay | Admitting: General Practice

## 2016-02-04 DIAGNOSIS — I482 Chronic atrial fibrillation: Secondary | ICD-10-CM | POA: Diagnosis not present

## 2016-02-04 DIAGNOSIS — I5033 Acute on chronic diastolic (congestive) heart failure: Secondary | ICD-10-CM | POA: Diagnosis not present

## 2016-02-04 DIAGNOSIS — I10 Essential (primary) hypertension: Secondary | ICD-10-CM | POA: Diagnosis not present

## 2016-02-11 DIAGNOSIS — I482 Chronic atrial fibrillation: Secondary | ICD-10-CM | POA: Diagnosis not present

## 2016-02-11 DIAGNOSIS — I5033 Acute on chronic diastolic (congestive) heart failure: Secondary | ICD-10-CM | POA: Diagnosis not present

## 2016-02-11 DIAGNOSIS — I10 Essential (primary) hypertension: Secondary | ICD-10-CM | POA: Diagnosis not present

## 2016-02-12 DIAGNOSIS — I10 Essential (primary) hypertension: Secondary | ICD-10-CM | POA: Diagnosis not present

## 2016-02-12 DIAGNOSIS — Z7901 Long term (current) use of anticoagulants: Secondary | ICD-10-CM | POA: Diagnosis not present

## 2016-02-12 DIAGNOSIS — I509 Heart failure, unspecified: Secondary | ICD-10-CM | POA: Diagnosis not present

## 2016-02-12 DIAGNOSIS — I480 Paroxysmal atrial fibrillation: Secondary | ICD-10-CM | POA: Diagnosis not present

## 2016-02-12 DIAGNOSIS — I5030 Unspecified diastolic (congestive) heart failure: Secondary | ICD-10-CM | POA: Diagnosis not present

## 2016-02-20 DIAGNOSIS — Z0000 Encounter for general adult medical examination without abnormal findings: Secondary | ICD-10-CM | POA: Diagnosis not present

## 2016-02-20 DIAGNOSIS — J019 Acute sinusitis, unspecified: Secondary | ICD-10-CM | POA: Diagnosis not present

## 2016-02-20 DIAGNOSIS — Z6827 Body mass index (BMI) 27.0-27.9, adult: Secondary | ICD-10-CM | POA: Diagnosis not present

## 2016-02-26 DIAGNOSIS — I10 Essential (primary) hypertension: Secondary | ICD-10-CM | POA: Diagnosis not present

## 2016-02-26 DIAGNOSIS — I5032 Chronic diastolic (congestive) heart failure: Secondary | ICD-10-CM | POA: Diagnosis not present

## 2016-02-26 DIAGNOSIS — I482 Chronic atrial fibrillation: Secondary | ICD-10-CM | POA: Diagnosis not present

## 2016-02-26 DIAGNOSIS — I5033 Acute on chronic diastolic (congestive) heart failure: Secondary | ICD-10-CM | POA: Diagnosis not present

## 2016-03-03 DIAGNOSIS — H40053 Ocular hypertension, bilateral: Secondary | ICD-10-CM | POA: Diagnosis not present

## 2016-03-03 DIAGNOSIS — H40013 Open angle with borderline findings, low risk, bilateral: Secondary | ICD-10-CM | POA: Diagnosis not present

## 2016-03-03 DIAGNOSIS — H524 Presbyopia: Secondary | ICD-10-CM | POA: Diagnosis not present

## 2016-03-03 DIAGNOSIS — Z961 Presence of intraocular lens: Secondary | ICD-10-CM | POA: Diagnosis not present

## 2016-03-03 DIAGNOSIS — H35372 Puckering of macula, left eye: Secondary | ICD-10-CM | POA: Diagnosis not present

## 2016-03-03 DIAGNOSIS — H52223 Regular astigmatism, bilateral: Secondary | ICD-10-CM | POA: Diagnosis not present

## 2016-03-03 DIAGNOSIS — H353131 Nonexudative age-related macular degeneration, bilateral, early dry stage: Secondary | ICD-10-CM | POA: Diagnosis not present

## 2016-03-03 DIAGNOSIS — H5213 Myopia, bilateral: Secondary | ICD-10-CM | POA: Diagnosis not present

## 2016-03-03 DIAGNOSIS — H43813 Vitreous degeneration, bilateral: Secondary | ICD-10-CM | POA: Diagnosis not present

## 2016-03-18 ENCOUNTER — Ambulatory Visit: Payer: Medicare Other | Admitting: Internal Medicine

## 2016-03-31 DIAGNOSIS — H40053 Ocular hypertension, bilateral: Secondary | ICD-10-CM | POA: Diagnosis not present

## 2016-03-31 DIAGNOSIS — H40013 Open angle with borderline findings, low risk, bilateral: Secondary | ICD-10-CM | POA: Diagnosis not present

## 2016-03-31 DIAGNOSIS — H40051 Ocular hypertension, right eye: Secondary | ICD-10-CM | POA: Diagnosis not present

## 2016-03-31 DIAGNOSIS — H35373 Puckering of macula, bilateral: Secondary | ICD-10-CM | POA: Diagnosis not present

## 2016-03-31 DIAGNOSIS — H353131 Nonexudative age-related macular degeneration, bilateral, early dry stage: Secondary | ICD-10-CM | POA: Diagnosis not present

## 2016-03-31 DIAGNOSIS — H40052 Ocular hypertension, left eye: Secondary | ICD-10-CM | POA: Diagnosis not present

## 2016-05-11 DIAGNOSIS — S51802D Unspecified open wound of left forearm, subsequent encounter: Secondary | ICD-10-CM | POA: Diagnosis not present

## 2016-05-11 DIAGNOSIS — Z7901 Long term (current) use of anticoagulants: Secondary | ICD-10-CM | POA: Diagnosis not present

## 2016-05-11 DIAGNOSIS — Z9181 History of falling: Secondary | ICD-10-CM | POA: Diagnosis not present

## 2016-05-11 DIAGNOSIS — N39498 Other specified urinary incontinence: Secondary | ICD-10-CM | POA: Diagnosis not present

## 2016-05-11 DIAGNOSIS — K5900 Constipation, unspecified: Secondary | ICD-10-CM | POA: Diagnosis not present

## 2016-05-11 DIAGNOSIS — N401 Enlarged prostate with lower urinary tract symptoms: Secondary | ICD-10-CM | POA: Diagnosis not present

## 2016-05-11 DIAGNOSIS — I4891 Unspecified atrial fibrillation: Secondary | ICD-10-CM | POA: Diagnosis not present

## 2016-05-11 DIAGNOSIS — I509 Heart failure, unspecified: Secondary | ICD-10-CM | POA: Diagnosis not present

## 2016-05-11 DIAGNOSIS — S61401D Unspecified open wound of right hand, subsequent encounter: Secondary | ICD-10-CM | POA: Diagnosis not present

## 2016-05-12 DIAGNOSIS — N401 Enlarged prostate with lower urinary tract symptoms: Secondary | ICD-10-CM | POA: Diagnosis not present

## 2016-05-12 DIAGNOSIS — I509 Heart failure, unspecified: Secondary | ICD-10-CM | POA: Diagnosis not present

## 2016-05-12 DIAGNOSIS — I4891 Unspecified atrial fibrillation: Secondary | ICD-10-CM | POA: Diagnosis not present

## 2016-05-12 DIAGNOSIS — S61401D Unspecified open wound of right hand, subsequent encounter: Secondary | ICD-10-CM | POA: Diagnosis not present

## 2016-05-12 DIAGNOSIS — S51802D Unspecified open wound of left forearm, subsequent encounter: Secondary | ICD-10-CM | POA: Diagnosis not present

## 2016-05-12 DIAGNOSIS — K5900 Constipation, unspecified: Secondary | ICD-10-CM | POA: Diagnosis not present

## 2016-05-13 DIAGNOSIS — I509 Heart failure, unspecified: Secondary | ICD-10-CM | POA: Diagnosis not present

## 2016-05-13 DIAGNOSIS — R0982 Postnasal drip: Secondary | ICD-10-CM | POA: Diagnosis not present

## 2016-05-13 DIAGNOSIS — K5900 Constipation, unspecified: Secondary | ICD-10-CM | POA: Diagnosis not present

## 2016-05-13 DIAGNOSIS — M21372 Foot drop, left foot: Secondary | ICD-10-CM | POA: Diagnosis not present

## 2016-05-13 DIAGNOSIS — Z9181 History of falling: Secondary | ICD-10-CM | POA: Diagnosis not present

## 2016-05-13 DIAGNOSIS — I4891 Unspecified atrial fibrillation: Secondary | ICD-10-CM | POA: Diagnosis not present

## 2016-05-13 DIAGNOSIS — S61401D Unspecified open wound of right hand, subsequent encounter: Secondary | ICD-10-CM | POA: Diagnosis not present

## 2016-05-13 DIAGNOSIS — N401 Enlarged prostate with lower urinary tract symptoms: Secondary | ICD-10-CM | POA: Diagnosis not present

## 2016-05-13 DIAGNOSIS — G63 Polyneuropathy in diseases classified elsewhere: Secondary | ICD-10-CM | POA: Diagnosis not present

## 2016-05-13 DIAGNOSIS — S51802D Unspecified open wound of left forearm, subsequent encounter: Secondary | ICD-10-CM | POA: Diagnosis not present

## 2016-05-15 DIAGNOSIS — S61401D Unspecified open wound of right hand, subsequent encounter: Secondary | ICD-10-CM | POA: Diagnosis not present

## 2016-05-15 DIAGNOSIS — K5900 Constipation, unspecified: Secondary | ICD-10-CM | POA: Diagnosis not present

## 2016-05-15 DIAGNOSIS — I4891 Unspecified atrial fibrillation: Secondary | ICD-10-CM | POA: Diagnosis not present

## 2016-05-15 DIAGNOSIS — S51802D Unspecified open wound of left forearm, subsequent encounter: Secondary | ICD-10-CM | POA: Diagnosis not present

## 2016-05-15 DIAGNOSIS — N401 Enlarged prostate with lower urinary tract symptoms: Secondary | ICD-10-CM | POA: Diagnosis not present

## 2016-05-15 DIAGNOSIS — I509 Heart failure, unspecified: Secondary | ICD-10-CM | POA: Diagnosis not present

## 2016-05-18 DIAGNOSIS — S51802D Unspecified open wound of left forearm, subsequent encounter: Secondary | ICD-10-CM | POA: Diagnosis not present

## 2016-05-18 DIAGNOSIS — K5900 Constipation, unspecified: Secondary | ICD-10-CM | POA: Diagnosis not present

## 2016-05-18 DIAGNOSIS — I4891 Unspecified atrial fibrillation: Secondary | ICD-10-CM | POA: Diagnosis not present

## 2016-05-18 DIAGNOSIS — N401 Enlarged prostate with lower urinary tract symptoms: Secondary | ICD-10-CM | POA: Diagnosis not present

## 2016-05-18 DIAGNOSIS — S61401D Unspecified open wound of right hand, subsequent encounter: Secondary | ICD-10-CM | POA: Diagnosis not present

## 2016-05-18 DIAGNOSIS — I509 Heart failure, unspecified: Secondary | ICD-10-CM | POA: Diagnosis not present

## 2016-05-20 DIAGNOSIS — I509 Heart failure, unspecified: Secondary | ICD-10-CM | POA: Diagnosis not present

## 2016-05-20 DIAGNOSIS — I4891 Unspecified atrial fibrillation: Secondary | ICD-10-CM | POA: Diagnosis not present

## 2016-05-20 DIAGNOSIS — N401 Enlarged prostate with lower urinary tract symptoms: Secondary | ICD-10-CM | POA: Diagnosis not present

## 2016-05-20 DIAGNOSIS — S61401D Unspecified open wound of right hand, subsequent encounter: Secondary | ICD-10-CM | POA: Diagnosis not present

## 2016-05-20 DIAGNOSIS — K5900 Constipation, unspecified: Secondary | ICD-10-CM | POA: Diagnosis not present

## 2016-05-20 DIAGNOSIS — S51802D Unspecified open wound of left forearm, subsequent encounter: Secondary | ICD-10-CM | POA: Diagnosis not present

## 2016-05-22 DIAGNOSIS — S51802D Unspecified open wound of left forearm, subsequent encounter: Secondary | ICD-10-CM | POA: Diagnosis not present

## 2016-05-22 DIAGNOSIS — S61401D Unspecified open wound of right hand, subsequent encounter: Secondary | ICD-10-CM | POA: Diagnosis not present

## 2016-05-22 DIAGNOSIS — N401 Enlarged prostate with lower urinary tract symptoms: Secondary | ICD-10-CM | POA: Diagnosis not present

## 2016-05-22 DIAGNOSIS — K5900 Constipation, unspecified: Secondary | ICD-10-CM | POA: Diagnosis not present

## 2016-05-22 DIAGNOSIS — I509 Heart failure, unspecified: Secondary | ICD-10-CM | POA: Diagnosis not present

## 2016-05-22 DIAGNOSIS — I4891 Unspecified atrial fibrillation: Secondary | ICD-10-CM | POA: Diagnosis not present

## 2016-05-25 DIAGNOSIS — S51802D Unspecified open wound of left forearm, subsequent encounter: Secondary | ICD-10-CM | POA: Diagnosis not present

## 2016-05-25 DIAGNOSIS — N401 Enlarged prostate with lower urinary tract symptoms: Secondary | ICD-10-CM | POA: Diagnosis not present

## 2016-05-25 DIAGNOSIS — K5900 Constipation, unspecified: Secondary | ICD-10-CM | POA: Diagnosis not present

## 2016-05-25 DIAGNOSIS — I509 Heart failure, unspecified: Secondary | ICD-10-CM | POA: Diagnosis not present

## 2016-05-25 DIAGNOSIS — I4891 Unspecified atrial fibrillation: Secondary | ICD-10-CM | POA: Diagnosis not present

## 2016-05-25 DIAGNOSIS — S61401D Unspecified open wound of right hand, subsequent encounter: Secondary | ICD-10-CM | POA: Diagnosis not present

## 2016-05-26 DIAGNOSIS — K5900 Constipation, unspecified: Secondary | ICD-10-CM | POA: Diagnosis not present

## 2016-05-26 DIAGNOSIS — N401 Enlarged prostate with lower urinary tract symptoms: Secondary | ICD-10-CM | POA: Diagnosis not present

## 2016-05-26 DIAGNOSIS — I509 Heart failure, unspecified: Secondary | ICD-10-CM | POA: Diagnosis not present

## 2016-05-26 DIAGNOSIS — S51802D Unspecified open wound of left forearm, subsequent encounter: Secondary | ICD-10-CM | POA: Diagnosis not present

## 2016-05-26 DIAGNOSIS — S61401D Unspecified open wound of right hand, subsequent encounter: Secondary | ICD-10-CM | POA: Diagnosis not present

## 2016-05-26 DIAGNOSIS — I4891 Unspecified atrial fibrillation: Secondary | ICD-10-CM | POA: Diagnosis not present

## 2016-05-27 DIAGNOSIS — N401 Enlarged prostate with lower urinary tract symptoms: Secondary | ICD-10-CM | POA: Diagnosis not present

## 2016-05-27 DIAGNOSIS — I509 Heart failure, unspecified: Secondary | ICD-10-CM | POA: Diagnosis not present

## 2016-05-27 DIAGNOSIS — S51802D Unspecified open wound of left forearm, subsequent encounter: Secondary | ICD-10-CM | POA: Diagnosis not present

## 2016-05-27 DIAGNOSIS — I4891 Unspecified atrial fibrillation: Secondary | ICD-10-CM | POA: Diagnosis not present

## 2016-05-27 DIAGNOSIS — K5900 Constipation, unspecified: Secondary | ICD-10-CM | POA: Diagnosis not present

## 2016-05-27 DIAGNOSIS — S61401D Unspecified open wound of right hand, subsequent encounter: Secondary | ICD-10-CM | POA: Diagnosis not present

## 2016-05-30 DIAGNOSIS — N401 Enlarged prostate with lower urinary tract symptoms: Secondary | ICD-10-CM | POA: Diagnosis not present

## 2016-05-30 DIAGNOSIS — I509 Heart failure, unspecified: Secondary | ICD-10-CM | POA: Diagnosis not present

## 2016-05-30 DIAGNOSIS — I4891 Unspecified atrial fibrillation: Secondary | ICD-10-CM | POA: Diagnosis not present

## 2016-05-30 DIAGNOSIS — S51802D Unspecified open wound of left forearm, subsequent encounter: Secondary | ICD-10-CM | POA: Diagnosis not present

## 2016-05-30 DIAGNOSIS — S61401D Unspecified open wound of right hand, subsequent encounter: Secondary | ICD-10-CM | POA: Diagnosis not present

## 2016-05-30 DIAGNOSIS — K5900 Constipation, unspecified: Secondary | ICD-10-CM | POA: Diagnosis not present

## 2016-06-01 DIAGNOSIS — I4891 Unspecified atrial fibrillation: Secondary | ICD-10-CM | POA: Diagnosis not present

## 2016-06-01 DIAGNOSIS — N401 Enlarged prostate with lower urinary tract symptoms: Secondary | ICD-10-CM | POA: Diagnosis not present

## 2016-06-01 DIAGNOSIS — K5900 Constipation, unspecified: Secondary | ICD-10-CM | POA: Diagnosis not present

## 2016-06-01 DIAGNOSIS — I10 Essential (primary) hypertension: Secondary | ICD-10-CM | POA: Diagnosis not present

## 2016-06-01 DIAGNOSIS — S51802D Unspecified open wound of left forearm, subsequent encounter: Secondary | ICD-10-CM | POA: Diagnosis not present

## 2016-06-01 DIAGNOSIS — I509 Heart failure, unspecified: Secondary | ICD-10-CM | POA: Diagnosis not present

## 2016-06-01 DIAGNOSIS — I5032 Chronic diastolic (congestive) heart failure: Secondary | ICD-10-CM | POA: Diagnosis not present

## 2016-06-01 DIAGNOSIS — I482 Chronic atrial fibrillation: Secondary | ICD-10-CM | POA: Diagnosis not present

## 2016-06-01 DIAGNOSIS — S61401D Unspecified open wound of right hand, subsequent encounter: Secondary | ICD-10-CM | POA: Diagnosis not present

## 2016-06-03 DIAGNOSIS — S61401D Unspecified open wound of right hand, subsequent encounter: Secondary | ICD-10-CM | POA: Diagnosis not present

## 2016-06-03 DIAGNOSIS — S51802D Unspecified open wound of left forearm, subsequent encounter: Secondary | ICD-10-CM | POA: Diagnosis not present

## 2016-06-03 DIAGNOSIS — N401 Enlarged prostate with lower urinary tract symptoms: Secondary | ICD-10-CM | POA: Diagnosis not present

## 2016-06-03 DIAGNOSIS — I4891 Unspecified atrial fibrillation: Secondary | ICD-10-CM | POA: Diagnosis not present

## 2016-06-03 DIAGNOSIS — K5900 Constipation, unspecified: Secondary | ICD-10-CM | POA: Diagnosis not present

## 2016-06-03 DIAGNOSIS — I509 Heart failure, unspecified: Secondary | ICD-10-CM | POA: Diagnosis not present

## 2016-06-08 DIAGNOSIS — N182 Chronic kidney disease, stage 2 (mild): Secondary | ICD-10-CM | POA: Diagnosis not present

## 2016-06-08 DIAGNOSIS — E7800 Pure hypercholesterolemia, unspecified: Secondary | ICD-10-CM | POA: Diagnosis not present

## 2016-06-08 DIAGNOSIS — R7309 Other abnormal glucose: Secondary | ICD-10-CM | POA: Diagnosis not present

## 2016-06-08 DIAGNOSIS — I480 Paroxysmal atrial fibrillation: Secondary | ICD-10-CM | POA: Diagnosis not present

## 2016-06-08 DIAGNOSIS — I10 Essential (primary) hypertension: Secondary | ICD-10-CM | POA: Diagnosis not present

## 2016-06-09 DIAGNOSIS — S51802D Unspecified open wound of left forearm, subsequent encounter: Secondary | ICD-10-CM | POA: Diagnosis not present

## 2016-06-09 DIAGNOSIS — I509 Heart failure, unspecified: Secondary | ICD-10-CM | POA: Diagnosis not present

## 2016-06-09 DIAGNOSIS — N401 Enlarged prostate with lower urinary tract symptoms: Secondary | ICD-10-CM | POA: Diagnosis not present

## 2016-06-09 DIAGNOSIS — I4891 Unspecified atrial fibrillation: Secondary | ICD-10-CM | POA: Diagnosis not present

## 2016-06-09 DIAGNOSIS — K5900 Constipation, unspecified: Secondary | ICD-10-CM | POA: Diagnosis not present

## 2016-06-09 DIAGNOSIS — S61401D Unspecified open wound of right hand, subsequent encounter: Secondary | ICD-10-CM | POA: Diagnosis not present

## 2016-06-12 DIAGNOSIS — N401 Enlarged prostate with lower urinary tract symptoms: Secondary | ICD-10-CM | POA: Diagnosis not present

## 2016-06-12 DIAGNOSIS — S51802D Unspecified open wound of left forearm, subsequent encounter: Secondary | ICD-10-CM | POA: Diagnosis not present

## 2016-06-12 DIAGNOSIS — S61401D Unspecified open wound of right hand, subsequent encounter: Secondary | ICD-10-CM | POA: Diagnosis not present

## 2016-06-12 DIAGNOSIS — K5900 Constipation, unspecified: Secondary | ICD-10-CM | POA: Diagnosis not present

## 2016-06-12 DIAGNOSIS — I509 Heart failure, unspecified: Secondary | ICD-10-CM | POA: Diagnosis not present

## 2016-06-12 DIAGNOSIS — I4891 Unspecified atrial fibrillation: Secondary | ICD-10-CM | POA: Diagnosis not present

## 2016-06-16 DIAGNOSIS — Z6829 Body mass index (BMI) 29.0-29.9, adult: Secondary | ICD-10-CM | POA: Diagnosis not present

## 2016-06-16 DIAGNOSIS — Z0000 Encounter for general adult medical examination without abnormal findings: Secondary | ICD-10-CM | POA: Diagnosis not present

## 2016-06-16 DIAGNOSIS — I5022 Chronic systolic (congestive) heart failure: Secondary | ICD-10-CM | POA: Diagnosis not present

## 2016-09-27 IMAGING — NM NM MISC PROCEDURE
2 series · 12 of 12 positions shown · non-contrast
Comparison: none

[Series 1: rest_(id)_sa · 6.4mm · 6.40mm/px · 6 of 64 frames shown]
[frame 6/64]
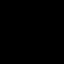
[frame 16/64]
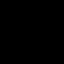
[frame 27/64]
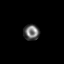
[frame 38/64]
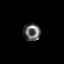
[frame 48/64]
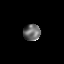
[frame 59/64]
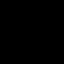

[Series 1: stress_(id)_sa · 6.4mm · 6.40mm/px · 6 of 64 frames shown]
[frame 6/64]
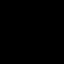
[frame 16/64]
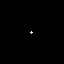
[frame 27/64]
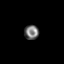
[frame 38/64]
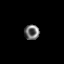
[frame 48/64]
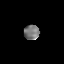
[frame 59/64]
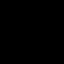

[12 of 12 positions shown; findings below may reference images not displayed]

Canned report from images found in remote index.

Refer to host system for actual result text.

## 2016-10-15 DIAGNOSIS — I5022 Chronic systolic (congestive) heart failure: Secondary | ICD-10-CM | POA: Diagnosis not present

## 2016-10-15 DIAGNOSIS — M159 Polyosteoarthritis, unspecified: Secondary | ICD-10-CM | POA: Diagnosis not present

## 2016-10-15 DIAGNOSIS — I481 Persistent atrial fibrillation: Secondary | ICD-10-CM | POA: Diagnosis not present

## 2016-10-22 DIAGNOSIS — I10 Essential (primary) hypertension: Secondary | ICD-10-CM | POA: Diagnosis not present

## 2016-10-22 DIAGNOSIS — I129 Hypertensive chronic kidney disease with stage 1 through stage 4 chronic kidney disease, or unspecified chronic kidney disease: Secondary | ICD-10-CM | POA: Diagnosis not present

## 2016-10-22 DIAGNOSIS — I480 Paroxysmal atrial fibrillation: Secondary | ICD-10-CM | POA: Diagnosis not present

## 2016-10-22 DIAGNOSIS — I5022 Chronic systolic (congestive) heart failure: Secondary | ICD-10-CM | POA: Diagnosis not present

## 2016-11-19 DIAGNOSIS — I5032 Chronic diastolic (congestive) heart failure: Secondary | ICD-10-CM | POA: Diagnosis not present

## 2016-11-19 DIAGNOSIS — I482 Chronic atrial fibrillation: Secondary | ICD-10-CM | POA: Diagnosis not present

## 2016-11-19 DIAGNOSIS — I10 Essential (primary) hypertension: Secondary | ICD-10-CM | POA: Diagnosis not present

## 2016-11-26 DIAGNOSIS — M62838 Other muscle spasm: Secondary | ICD-10-CM | POA: Diagnosis not present

## 2016-11-26 DIAGNOSIS — J302 Other seasonal allergic rhinitis: Secondary | ICD-10-CM | POA: Diagnosis not present

## 2016-11-26 DIAGNOSIS — R319 Hematuria, unspecified: Secondary | ICD-10-CM | POA: Diagnosis not present

## 2016-11-26 DIAGNOSIS — I4891 Unspecified atrial fibrillation: Secondary | ICD-10-CM | POA: Diagnosis not present

## 2016-12-10 DIAGNOSIS — I5022 Chronic systolic (congestive) heart failure: Secondary | ICD-10-CM | POA: Diagnosis not present

## 2016-12-10 DIAGNOSIS — I480 Paroxysmal atrial fibrillation: Secondary | ICD-10-CM | POA: Diagnosis not present

## 2016-12-10 DIAGNOSIS — R319 Hematuria, unspecified: Secondary | ICD-10-CM | POA: Diagnosis not present

## 2016-12-21 DIAGNOSIS — N5203 Combined arterial insufficiency and corporo-venous occlusive erectile dysfunction: Secondary | ICD-10-CM | POA: Diagnosis not present

## 2016-12-21 DIAGNOSIS — R310 Gross hematuria: Secondary | ICD-10-CM | POA: Diagnosis not present

## 2016-12-25 DIAGNOSIS — Z23 Encounter for immunization: Secondary | ICD-10-CM | POA: Diagnosis not present

## 2016-12-30 DIAGNOSIS — R310 Gross hematuria: Secondary | ICD-10-CM | POA: Diagnosis not present

## 2016-12-30 DIAGNOSIS — N400 Enlarged prostate without lower urinary tract symptoms: Secondary | ICD-10-CM | POA: Diagnosis not present

## 2017-02-11 DIAGNOSIS — I480 Paroxysmal atrial fibrillation: Secondary | ICD-10-CM | POA: Diagnosis not present

## 2017-02-11 DIAGNOSIS — N181 Chronic kidney disease, stage 1: Secondary | ICD-10-CM | POA: Diagnosis not present

## 2017-02-11 DIAGNOSIS — R7301 Impaired fasting glucose: Secondary | ICD-10-CM | POA: Diagnosis not present

## 2017-02-11 DIAGNOSIS — E539 Vitamin B deficiency, unspecified: Secondary | ICD-10-CM | POA: Diagnosis not present

## 2017-02-11 DIAGNOSIS — I129 Hypertensive chronic kidney disease with stage 1 through stage 4 chronic kidney disease, or unspecified chronic kidney disease: Secondary | ICD-10-CM | POA: Diagnosis not present

## 2017-02-11 DIAGNOSIS — E782 Mixed hyperlipidemia: Secondary | ICD-10-CM | POA: Diagnosis not present

## 2017-02-11 DIAGNOSIS — E559 Vitamin D deficiency, unspecified: Secondary | ICD-10-CM | POA: Diagnosis not present

## 2017-02-16 DIAGNOSIS — I481 Persistent atrial fibrillation: Secondary | ICD-10-CM | POA: Diagnosis not present

## 2017-02-16 DIAGNOSIS — E871 Hypo-osmolality and hyponatremia: Secondary | ICD-10-CM | POA: Diagnosis not present

## 2017-02-16 DIAGNOSIS — I10 Essential (primary) hypertension: Secondary | ICD-10-CM | POA: Diagnosis not present

## 2017-02-16 DIAGNOSIS — I129 Hypertensive chronic kidney disease with stage 1 through stage 4 chronic kidney disease, or unspecified chronic kidney disease: Secondary | ICD-10-CM | POA: Diagnosis not present

## 2017-02-16 DIAGNOSIS — I5032 Chronic diastolic (congestive) heart failure: Secondary | ICD-10-CM | POA: Diagnosis not present

## 2017-03-01 IMAGING — CR DG CHEST 2V
2 series · 2 of 2 positions shown · non-contrast
Comparison: None

CLINICAL DATA: Shortness of breath for 2 weeks, swelling in legs

EXAM:
CHEST  2 VIEW

[w chest pa]
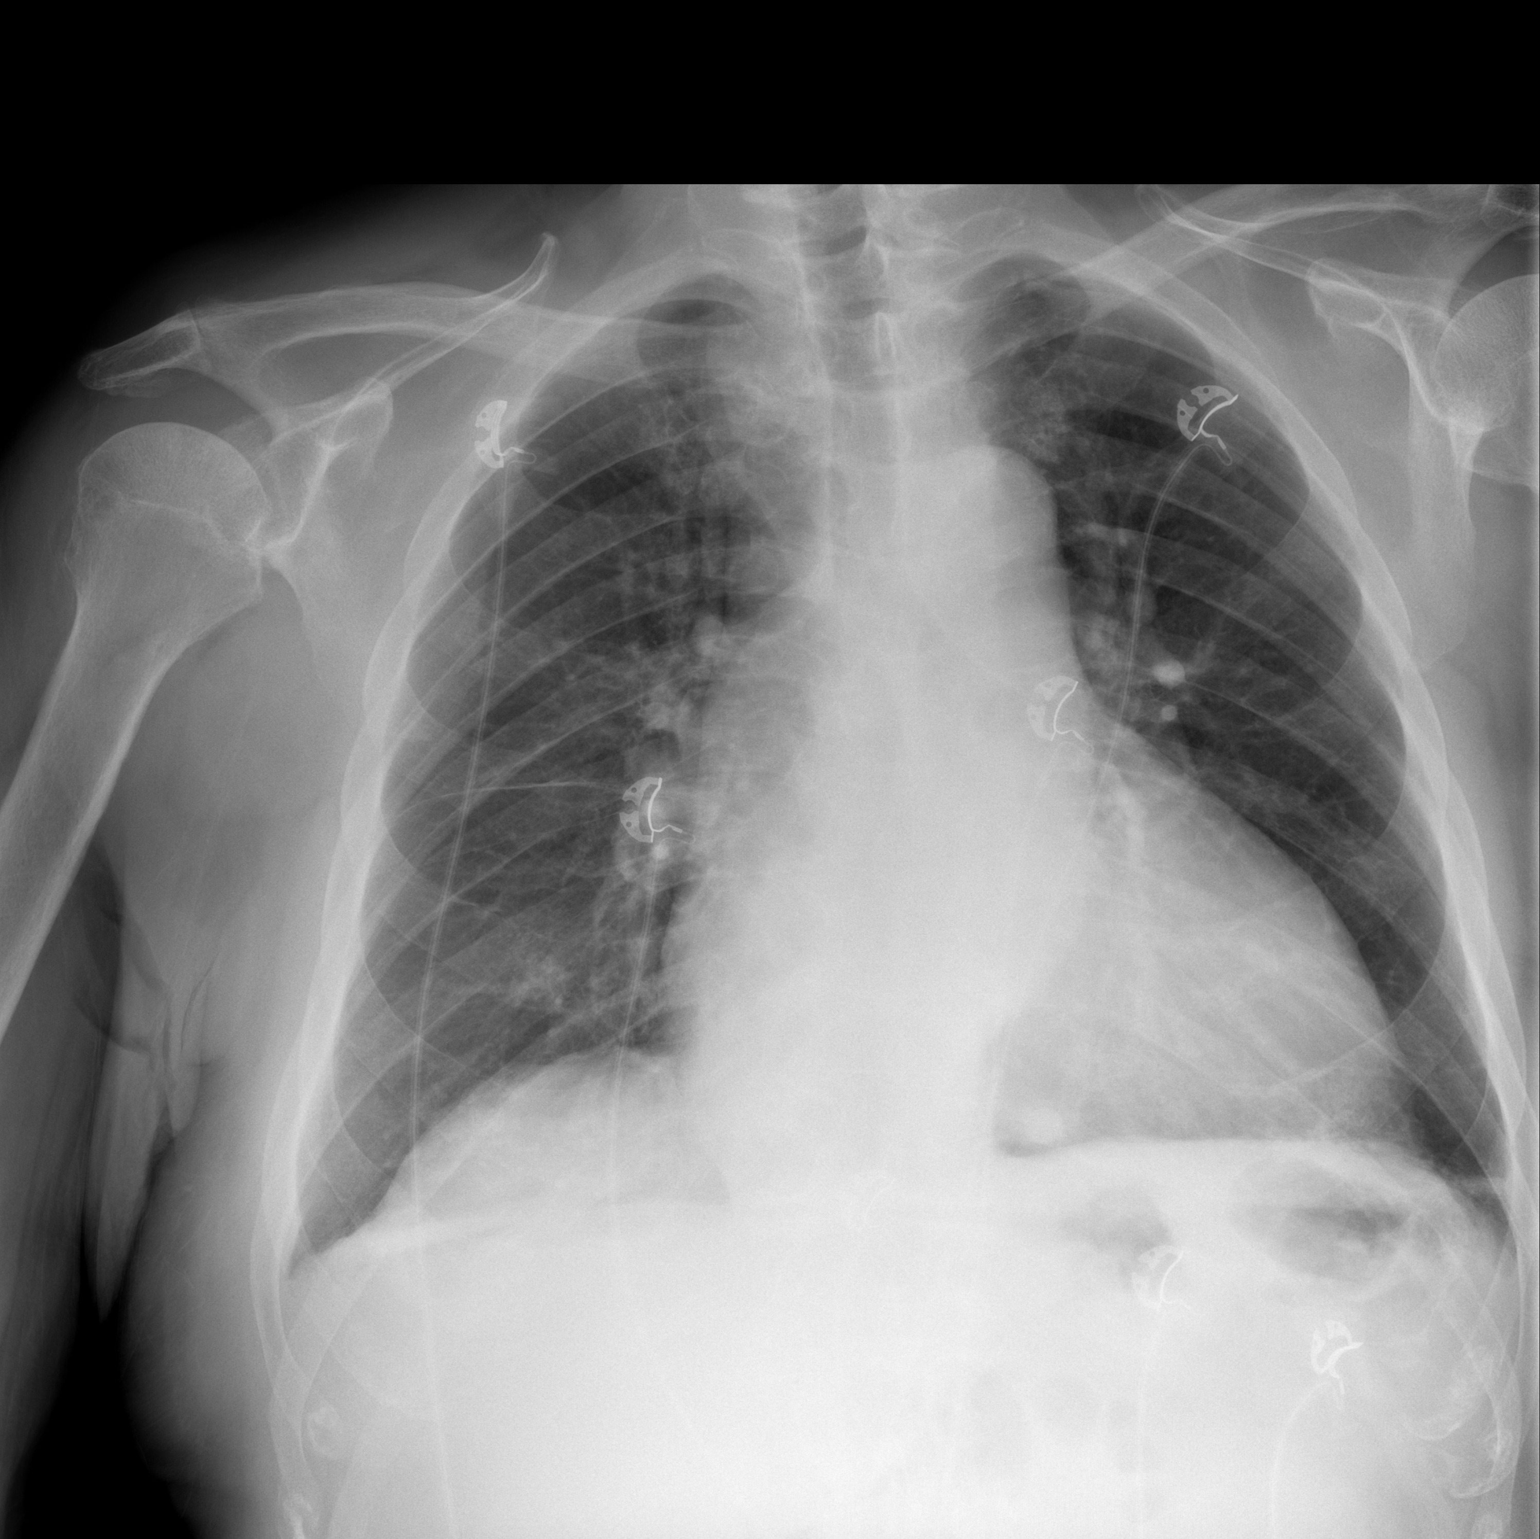

[w chest lat]
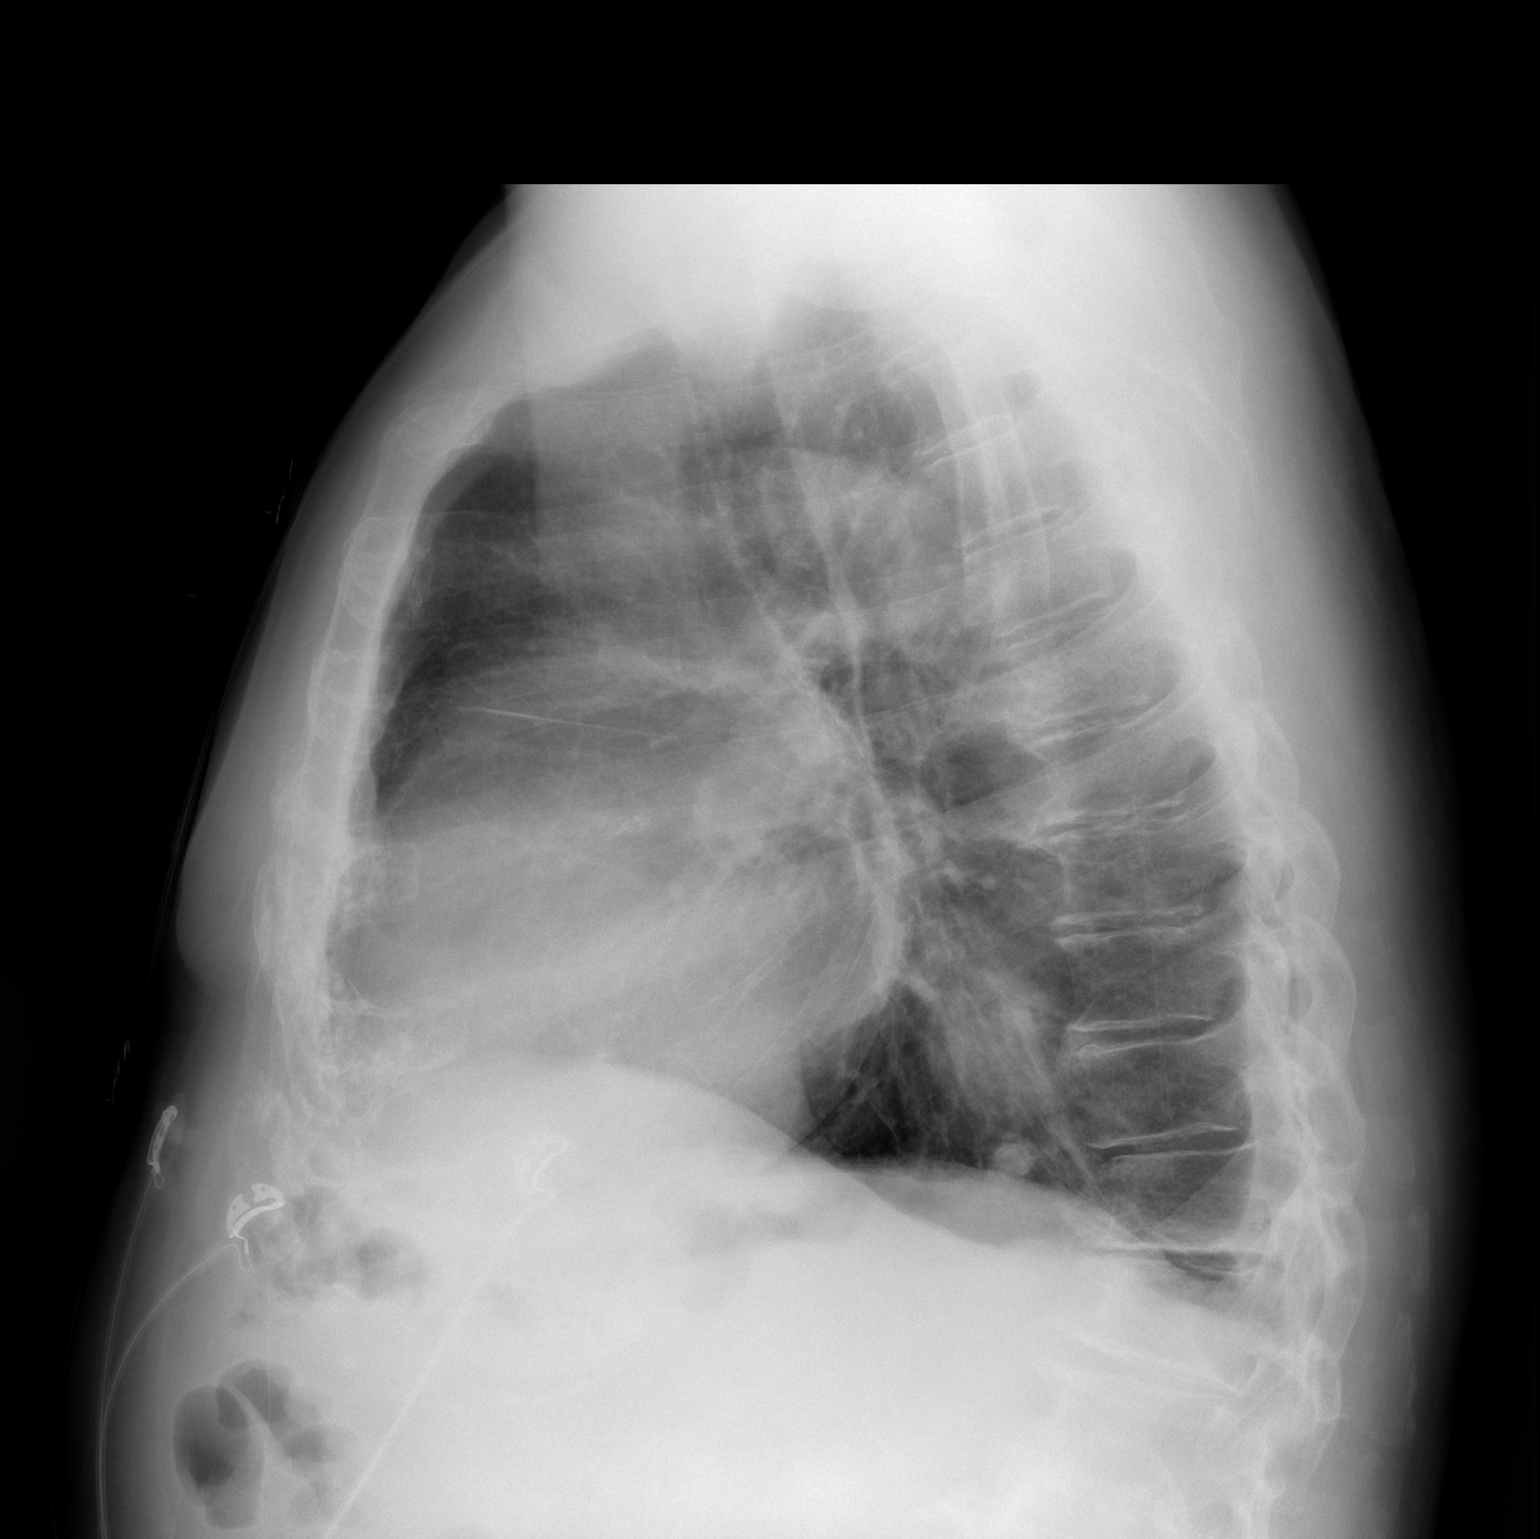

[2 of 2 positions shown; findings below may reference images not displayed]

FINDINGS: Enlargement of cardiac silhouette.

Tortuous aorta.

Pulmonary vascular congestion.

No acute failure or consolidation.

Minimal bibasilar atelectasis.

Calcified granuloma medial LEFT lower lobe.

No pleural effusion or pneumothorax.

Bones unremarkable.
IMPRESSION: Enlargement of cardiac silhouette with pulmonary vascular
congestion.

Bibasilar atelectasis and old granulomatous disease without definite
acute infiltrate.

## 2017-03-08 DIAGNOSIS — H4311 Vitreous hemorrhage, right eye: Secondary | ICD-10-CM | POA: Diagnosis not present

## 2017-03-08 DIAGNOSIS — H353131 Nonexudative age-related macular degeneration, bilateral, early dry stage: Secondary | ICD-10-CM | POA: Diagnosis not present

## 2017-03-08 DIAGNOSIS — H35372 Puckering of macula, left eye: Secondary | ICD-10-CM | POA: Diagnosis not present

## 2017-03-08 DIAGNOSIS — H5213 Myopia, bilateral: Secondary | ICD-10-CM | POA: Diagnosis not present

## 2017-03-08 DIAGNOSIS — H52223 Regular astigmatism, bilateral: Secondary | ICD-10-CM | POA: Diagnosis not present

## 2017-03-08 DIAGNOSIS — H43813 Vitreous degeneration, bilateral: Secondary | ICD-10-CM | POA: Diagnosis not present

## 2017-03-08 DIAGNOSIS — H524 Presbyopia: Secondary | ICD-10-CM | POA: Diagnosis not present

## 2017-03-08 DIAGNOSIS — Z961 Presence of intraocular lens: Secondary | ICD-10-CM | POA: Diagnosis not present

## 2017-04-19 DIAGNOSIS — H35343 Macular cyst, hole, or pseudohole, bilateral: Secondary | ICD-10-CM | POA: Diagnosis not present

## 2017-04-19 DIAGNOSIS — H35373 Puckering of macula, bilateral: Secondary | ICD-10-CM | POA: Diagnosis not present

## 2017-05-03 DIAGNOSIS — I5042 Chronic combined systolic (congestive) and diastolic (congestive) heart failure: Secondary | ICD-10-CM | POA: Diagnosis not present

## 2017-05-03 DIAGNOSIS — I427 Cardiomyopathy due to drug and external agent: Secondary | ICD-10-CM | POA: Diagnosis not present

## 2017-05-03 DIAGNOSIS — I482 Chronic atrial fibrillation: Secondary | ICD-10-CM | POA: Diagnosis not present

## 2017-05-03 DIAGNOSIS — I10 Essential (primary) hypertension: Secondary | ICD-10-CM | POA: Diagnosis not present

## 2017-05-18 DIAGNOSIS — I10 Essential (primary) hypertension: Secondary | ICD-10-CM | POA: Diagnosis not present

## 2017-05-18 DIAGNOSIS — I5042 Chronic combined systolic (congestive) and diastolic (congestive) heart failure: Secondary | ICD-10-CM | POA: Diagnosis not present

## 2017-05-18 DIAGNOSIS — I427 Cardiomyopathy due to drug and external agent: Secondary | ICD-10-CM | POA: Diagnosis not present

## 2017-05-18 DIAGNOSIS — I482 Chronic atrial fibrillation: Secondary | ICD-10-CM | POA: Diagnosis not present

## 2017-05-20 DIAGNOSIS — N182 Chronic kidney disease, stage 2 (mild): Secondary | ICD-10-CM | POA: Diagnosis present

## 2017-05-20 DIAGNOSIS — I10 Essential (primary) hypertension: Secondary | ICD-10-CM | POA: Diagnosis not present

## 2017-05-20 DIAGNOSIS — N400 Enlarged prostate without lower urinary tract symptoms: Secondary | ICD-10-CM | POA: Diagnosis present

## 2017-05-20 DIAGNOSIS — Z79899 Other long term (current) drug therapy: Secondary | ICD-10-CM | POA: Diagnosis not present

## 2017-05-20 DIAGNOSIS — I255 Ischemic cardiomyopathy: Secondary | ICD-10-CM | POA: Diagnosis not present

## 2017-05-20 DIAGNOSIS — I130 Hypertensive heart and chronic kidney disease with heart failure and stage 1 through stage 4 chronic kidney disease, or unspecified chronic kidney disease: Secondary | ICD-10-CM | POA: Diagnosis present

## 2017-05-20 DIAGNOSIS — I4891 Unspecified atrial fibrillation: Secondary | ICD-10-CM | POA: Diagnosis not present

## 2017-05-20 DIAGNOSIS — I429 Cardiomyopathy, unspecified: Secondary | ICD-10-CM | POA: Diagnosis not present

## 2017-05-20 DIAGNOSIS — I5041 Acute combined systolic (congestive) and diastolic (congestive) heart failure: Secondary | ICD-10-CM | POA: Diagnosis not present

## 2017-05-20 DIAGNOSIS — I482 Chronic atrial fibrillation: Secondary | ICD-10-CM | POA: Diagnosis not present

## 2017-05-20 DIAGNOSIS — R0602 Shortness of breath: Secondary | ICD-10-CM | POA: Diagnosis not present

## 2017-05-20 DIAGNOSIS — I5043 Acute on chronic combined systolic (congestive) and diastolic (congestive) heart failure: Secondary | ICD-10-CM | POA: Diagnosis not present

## 2017-05-20 DIAGNOSIS — Z9852 Vasectomy status: Secondary | ICD-10-CM | POA: Diagnosis not present

## 2017-06-02 DIAGNOSIS — I10 Essential (primary) hypertension: Secondary | ICD-10-CM | POA: Diagnosis not present

## 2017-06-02 DIAGNOSIS — I129 Hypertensive chronic kidney disease with stage 1 through stage 4 chronic kidney disease, or unspecified chronic kidney disease: Secondary | ICD-10-CM | POA: Diagnosis not present

## 2017-06-02 DIAGNOSIS — N390 Urinary tract infection, site not specified: Secondary | ICD-10-CM | POA: Diagnosis not present

## 2017-06-02 DIAGNOSIS — I5032 Chronic diastolic (congestive) heart failure: Secondary | ICD-10-CM | POA: Diagnosis not present

## 2017-06-02 DIAGNOSIS — N182 Chronic kidney disease, stage 2 (mild): Secondary | ICD-10-CM | POA: Diagnosis not present

## 2017-06-02 DIAGNOSIS — E119 Type 2 diabetes mellitus without complications: Secondary | ICD-10-CM | POA: Diagnosis not present

## 2017-06-07 DIAGNOSIS — I129 Hypertensive chronic kidney disease with stage 1 through stage 4 chronic kidney disease, or unspecified chronic kidney disease: Secondary | ICD-10-CM | POA: Diagnosis not present

## 2017-06-07 DIAGNOSIS — I480 Paroxysmal atrial fibrillation: Secondary | ICD-10-CM | POA: Diagnosis not present

## 2017-06-07 DIAGNOSIS — I5022 Chronic systolic (congestive) heart failure: Secondary | ICD-10-CM | POA: Diagnosis not present

## 2017-06-07 DIAGNOSIS — I482 Chronic atrial fibrillation: Secondary | ICD-10-CM | POA: Diagnosis not present

## 2017-06-07 DIAGNOSIS — I427 Cardiomyopathy due to drug and external agent: Secondary | ICD-10-CM | POA: Diagnosis not present

## 2017-06-07 DIAGNOSIS — I5042 Chronic combined systolic (congestive) and diastolic (congestive) heart failure: Secondary | ICD-10-CM | POA: Diagnosis not present

## 2017-06-07 DIAGNOSIS — I10 Essential (primary) hypertension: Secondary | ICD-10-CM | POA: Diagnosis not present

## 2017-09-16 DIAGNOSIS — N182 Chronic kidney disease, stage 2 (mild): Secondary | ICD-10-CM | POA: Diagnosis not present

## 2017-09-16 DIAGNOSIS — I5022 Chronic systolic (congestive) heart failure: Secondary | ICD-10-CM | POA: Diagnosis not present

## 2017-09-16 DIAGNOSIS — I480 Paroxysmal atrial fibrillation: Secondary | ICD-10-CM | POA: Diagnosis not present

## 2017-09-16 DIAGNOSIS — Z23 Encounter for immunization: Secondary | ICD-10-CM | POA: Diagnosis not present

## 2017-09-16 DIAGNOSIS — Z0000 Encounter for general adult medical examination without abnormal findings: Secondary | ICD-10-CM | POA: Diagnosis not present

## 2017-09-16 DIAGNOSIS — I5040 Unspecified combined systolic (congestive) and diastolic (congestive) heart failure: Secondary | ICD-10-CM | POA: Diagnosis not present

## 2017-09-16 DIAGNOSIS — E782 Mixed hyperlipidemia: Secondary | ICD-10-CM | POA: Diagnosis not present

## 2017-09-16 DIAGNOSIS — I129 Hypertensive chronic kidney disease with stage 1 through stage 4 chronic kidney disease, or unspecified chronic kidney disease: Secondary | ICD-10-CM | POA: Diagnosis not present

## 2017-09-24 DIAGNOSIS — I10 Essential (primary) hypertension: Secondary | ICD-10-CM | POA: Diagnosis not present

## 2017-09-24 DIAGNOSIS — I482 Chronic atrial fibrillation: Secondary | ICD-10-CM | POA: Diagnosis not present

## 2017-09-24 DIAGNOSIS — I5042 Chronic combined systolic (congestive) and diastolic (congestive) heart failure: Secondary | ICD-10-CM | POA: Diagnosis not present

## 2017-09-24 DIAGNOSIS — I427 Cardiomyopathy due to drug and external agent: Secondary | ICD-10-CM | POA: Diagnosis not present

## 2017-11-10 DIAGNOSIS — I480 Paroxysmal atrial fibrillation: Secondary | ICD-10-CM | POA: Diagnosis not present

## 2017-11-10 DIAGNOSIS — J309 Allergic rhinitis, unspecified: Secondary | ICD-10-CM | POA: Diagnosis not present

## 2017-11-10 DIAGNOSIS — I5022 Chronic systolic (congestive) heart failure: Secondary | ICD-10-CM | POA: Diagnosis not present

## 2017-11-10 DIAGNOSIS — I10 Essential (primary) hypertension: Secondary | ICD-10-CM | POA: Diagnosis not present
